# Patient Record
Sex: Female | Born: 1985 | Hispanic: No | Marital: Married | Smoking: Never smoker
Health system: Southern US, Community
[De-identification: ages and names within clinical notes are randomized; demographics above are authoritative.]

## PROBLEM LIST (undated history)

## (undated) DIAGNOSIS — J45909 Unspecified asthma, uncomplicated: Secondary | ICD-10-CM

## (undated) HISTORY — PX: WRIST SURGERY: SHX841

---

## 2010-05-05 ENCOUNTER — Other Ambulatory Visit (HOSPITAL_COMMUNITY): Payer: Self-pay | Admitting: Orthopedic Surgery

## 2010-05-05 DIAGNOSIS — R52 Pain, unspecified: Secondary | ICD-10-CM

## 2010-05-10 ENCOUNTER — Ambulatory Visit (HOSPITAL_COMMUNITY)
Admission: RE | Admit: 2010-05-10 | Discharge: 2010-05-10 | Disposition: A | Discharge status: Home / Self Care | Payer: 59 | Source: Ambulatory Visit | Attending: Orthopedic Surgery | Admitting: Orthopedic Surgery

## 2010-05-10 DIAGNOSIS — M25539 Pain in unspecified wrist: Secondary | ICD-10-CM | POA: Insufficient documentation

## 2010-05-10 DIAGNOSIS — R52 Pain, unspecified: Secondary | ICD-10-CM

## 2010-07-07 ENCOUNTER — Ambulatory Visit (HOSPITAL_BASED_OUTPATIENT_CLINIC_OR_DEPARTMENT_OTHER): Admission: RE | Admit: 2010-07-07 | Payer: 59 | Source: Ambulatory Visit | Admitting: Orthopedic Surgery

## 2010-07-07 ENCOUNTER — Ambulatory Visit (HOSPITAL_BASED_OUTPATIENT_CLINIC_OR_DEPARTMENT_OTHER)
Admission: RE | Admit: 2010-07-07 | Discharge: 2010-07-08 | Disposition: A | Discharge status: Home / Self Care | Payer: 59 | Source: Ambulatory Visit | Attending: Orthopedic Surgery | Admitting: Orthopedic Surgery

## 2010-07-07 DIAGNOSIS — J45909 Unspecified asthma, uncomplicated: Secondary | ICD-10-CM | POA: Insufficient documentation

## 2010-07-07 DIAGNOSIS — IMO0002 Reserved for concepts with insufficient information to code with codable children: Secondary | ICD-10-CM | POA: Insufficient documentation

## 2010-07-10 NOTE — Op Note (Signed)
[name], [name]                ACCOUNT NO.:  [id number]  MEDICAL RECORD NO.:  [id number]  LOCATION:                                 FACILITY:  PHYSICIAN:  Kimberly Ano. 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  POSTOPERATIVE DIAGNOSIS: 1. Right wrist scaphoid excision and four-point effusion with diamond     plate from [employer] and autologous bone graft in the     distal radius. 2. Posterior interosseous nerve neurectomy, right wrist. 3. EPL decompression anterior transposition, right wrist. 4. Stress radiography.  SURGEON:  Kimberly Ano. Kimberly Pea, MD  ASSISTANT:  None.  COMPLICATIONS:  None.  ANESTHESIA:  General with preoperative block.  TOURNIQUET TIME:  Less than 90 minutes.INDICATIONS FOR PROCEDURE:  This patient is a pleasant female who presents with the above-mentioned diagnosis.  I have counseled her in regards to risks and benefits of surgery including risk of infection, bleeding, anesthesia, damage to normal structures, and failure of surgery to accomplish its intended goals of relieving symptoms and restoring function.  With this in mind, she desires to proceed.  All questions have been encouraged and answered preoperatively.  OPERATIVE PROCEDURE:  The patient was seen by myself and Anesthesia, taken to operative suite.  Time-out had been called in the operative arena and verified.  Preoperative block given and she was of course, prepped and draped in the usual sterile fashion with Betadine scrub and paint.  Following a thorough prep and drape, the patient underwent elevation of the arm.  Tourniquet was insufflated to 250 mmHg.  A dorsal incision was made midline about the wrist.  Dissection was carried down. Skin flaps were  elevated.  The EPL was isolated out of its tendon sheath, transferred to the dorsal subcutaneous tissues after flap was elevated and fascia was released.  Following this, a continued Z lengthening was accomplished about the retinaculum and opening between the third and fourth dorsal compartment created.  I released the second dorsal compartment and then made access to the wrist joint.  Once this was done, I performed a posterior interosseous nerve neurectomy with crushing and cauterization technique which the patient tolerated well. Following this, the patient then underwent a Z opening incision about the wrist capsule.  Once this was done, I then excised the scaphoid, obtained autologous bone graft from the scaphoid, and then obtained in August bone graft from the curettage over Lister tubercle.  Following this, the 4 corners were denuded about the capitate, hamate, lunate, and triquetrum.  Once this was performed, I then placed ample amounts of the autologous bone graft in the 4 corners, reduced and pinned the capitate, hamate, lunate, and triquetrum.  I was shown stress radiography that the patient had adequate alignment, the lunate was in neutral position and there were no complicating features.  Following this, I placed a reamer for the diamond plate in standard fashion, reamed, and then placed the plate.  The templates were placed.  Reaming was accomplished. Additional bone graft  taken and following this, I then placed guide pins in the [employer] plate.  The pins were then replaced with screws in the lunate, followed by hamate, followed by triquetrum, followed by capitate.  These all achieved excellent purchase. Additional bone graft was placed.  Following this, the patient had 50% arc of motion, no complicating features, and all looked very well.  I was quite pleased with this and the findings.  Following this, I then closed the capsule.  Once the capsule was closed, I  then irrigated copiously once again as I did multiple points during the case.  I placed a portion of the retinaculum against the capsule and tucked this tight against the capsule.  I then placed another limb against the patient's retinaculum house of the fourth dorsal compartment so as to prevent both straining.  The patient tolerated this well and no complicating features.  Following this, I then performed a very careful and cautious closure with placement of the TLSO drain followed by closure with 3-0 Vicryl and Prolene.  I should note the capsule was nicely closed with FiberWire, the retinaculum (50% retinaculum) was placed against the capsule and the other 50% in the Z lengthened retinaculum.  The patient tolerated this well.  The EPL tendon was left in a transposed position.  She had 50% arc of motion, looked excellent.  There were no complicating features.  I irrigated copiously at multiple points during the procedure and was very pleased with stress radiographs.  Following closing the wound, we then placed her in a short-arm splint.  She will be placed in a short-arm splint.  We will see her back in 12 days.  She will be admitted for an overnight stay and observatory care with antibiotics, pain management and our standard postop algorithm.  In addition to this, I have discussed with her at 2 weeks postop we will cast her; at 4 weeks, we will remove splint; at 6 weeks, range of motion very gently and we will wait 8-10 weeks gain any strengthening.  Do's and dont's have been discussed.  All questions have been encouraged and answered.     Kimberly Ano. [name], M.D.     [facility]  D:  07/07/2010  T:  07/08/2010  Job:  [id number]  Electronically Signed by Dominica Severin M.D. on 07/10/2010 10:54:44 AM

## 2016-01-14 DIAGNOSIS — N6452 Nipple discharge: Secondary | ICD-10-CM | POA: Diagnosis not present

## 2016-01-14 DIAGNOSIS — Z131 Encounter for screening for diabetes mellitus: Secondary | ICD-10-CM | POA: Diagnosis not present

## 2016-01-14 DIAGNOSIS — Z6834 Body mass index (BMI) 34.0-34.9, adult: Secondary | ICD-10-CM | POA: Diagnosis not present

## 2016-01-14 DIAGNOSIS — Z01419 Encounter for gynecological examination (general) (routine) without abnormal findings: Secondary | ICD-10-CM | POA: Diagnosis not present

## 2016-01-14 DIAGNOSIS — Z113 Encounter for screening for infections with a predominantly sexual mode of transmission: Secondary | ICD-10-CM | POA: Diagnosis not present

## 2016-01-18 DIAGNOSIS — L03213 Periorbital cellulitis: Secondary | ICD-10-CM | POA: Diagnosis not present

## 2016-01-18 DIAGNOSIS — H00021 Hordeolum internum right upper eyelid: Secondary | ICD-10-CM | POA: Diagnosis not present

## 2016-02-02 DIAGNOSIS — L259 Unspecified contact dermatitis, unspecified cause: Secondary | ICD-10-CM | POA: Diagnosis not present

## 2016-03-23 DIAGNOSIS — H0013 Chalazion right eye, unspecified eyelid: Secondary | ICD-10-CM | POA: Diagnosis not present

## 2016-04-19 DIAGNOSIS — H0013 Chalazion right eye, unspecified eyelid: Secondary | ICD-10-CM | POA: Diagnosis not present

## 2016-05-20 DIAGNOSIS — J019 Acute sinusitis, unspecified: Secondary | ICD-10-CM | POA: Diagnosis not present

## 2016-05-20 DIAGNOSIS — B9689 Other specified bacterial agents as the cause of diseases classified elsewhere: Secondary | ICD-10-CM | POA: Diagnosis not present

## 2016-06-28 DIAGNOSIS — H52223 Regular astigmatism, bilateral: Secondary | ICD-10-CM | POA: Diagnosis not present

## 2016-12-04 DIAGNOSIS — J020 Streptococcal pharyngitis: Secondary | ICD-10-CM | POA: Diagnosis not present

## 2017-01-19 DIAGNOSIS — Z118 Encounter for screening for other infectious and parasitic diseases: Secondary | ICD-10-CM | POA: Diagnosis not present

## 2017-01-19 DIAGNOSIS — Z3041 Encounter for surveillance of contraceptive pills: Secondary | ICD-10-CM | POA: Diagnosis not present

## 2017-01-19 DIAGNOSIS — N760 Acute vaginitis: Secondary | ICD-10-CM | POA: Diagnosis not present

## 2017-01-19 DIAGNOSIS — R1032 Left lower quadrant pain: Secondary | ICD-10-CM | POA: Diagnosis not present

## 2017-01-19 DIAGNOSIS — Z113 Encounter for screening for infections with a predominantly sexual mode of transmission: Secondary | ICD-10-CM | POA: Diagnosis not present

## 2017-02-15 DIAGNOSIS — N83291 Other ovarian cyst, right side: Secondary | ICD-10-CM | POA: Diagnosis not present

## 2017-02-15 DIAGNOSIS — Z1159 Encounter for screening for other viral diseases: Secondary | ICD-10-CM | POA: Diagnosis not present

## 2017-02-15 DIAGNOSIS — Z01419 Encounter for gynecological examination (general) (routine) without abnormal findings: Secondary | ICD-10-CM | POA: Diagnosis not present

## 2017-02-15 DIAGNOSIS — Z6833 Body mass index (BMI) 33.0-33.9, adult: Secondary | ICD-10-CM | POA: Diagnosis not present

## 2017-02-25 DIAGNOSIS — J069 Acute upper respiratory infection, unspecified: Secondary | ICD-10-CM | POA: Diagnosis not present

## 2017-03-27 DIAGNOSIS — J019 Acute sinusitis, unspecified: Secondary | ICD-10-CM | POA: Diagnosis not present

## 2017-03-27 DIAGNOSIS — R05 Cough: Secondary | ICD-10-CM | POA: Diagnosis not present

## 2017-03-27 DIAGNOSIS — J45909 Unspecified asthma, uncomplicated: Secondary | ICD-10-CM | POA: Diagnosis not present

## 2017-04-10 DIAGNOSIS — E7849 Other hyperlipidemia: Secondary | ICD-10-CM | POA: Diagnosis not present

## 2017-04-10 DIAGNOSIS — F908 Attention-deficit hyperactivity disorder, other type: Secondary | ICD-10-CM | POA: Diagnosis not present

## 2017-04-10 DIAGNOSIS — Z0000 Encounter for general adult medical examination without abnormal findings: Secondary | ICD-10-CM | POA: Diagnosis not present

## 2017-04-10 DIAGNOSIS — R600 Localized edema: Secondary | ICD-10-CM | POA: Diagnosis not present

## 2018-01-09 NOTE — L&D Delivery Note (Signed)
Delivery Note At 3:19 AM a viable and healthy female was delivered via Vaginal, Spontaneous (Presentation:OA ; vtx ).  APGAR: 8, 9; weight pending .   Placenta status: spont, intact to path.( fetal heart rate) , .  Cord: short with the following complications:  avulsed.  Cord pH: none  Anesthesia:  epidural Episiotomy: None Lacerations:left  Periurethral; right Labial minora Suture Repair: 3.0 chromic Est. Blood Loss (mL): 480  Mom to postpartum.  Baby to Couplet care / Skin to Skin.  Stepheni Cameron A Shelvy Heckert 10/02/2018, 3:51 AM

## 2018-03-08 LAB — OB RESULTS CONSOLE HIV ANTIBODY (ROUTINE TESTING): HIV: NONREACTIVE

## 2018-03-08 LAB — OB RESULTS CONSOLE HEPATITIS B SURFACE ANTIGEN: Hepatitis B Surface Ag: NEGATIVE

## 2018-03-08 LAB — OB RESULTS CONSOLE RUBELLA ANTIBODY, IGM: Rubella: IMMUNE

## 2018-05-02 ENCOUNTER — Institutional Professional Consult (permissible substitution): Payer: Self-pay | Admitting: Internal Medicine

## 2018-05-02 ENCOUNTER — Other Ambulatory Visit: Payer: Self-pay

## 2018-05-02 ENCOUNTER — Encounter: Payer: Self-pay | Admitting: Internal Medicine

## 2018-05-02 ENCOUNTER — Ambulatory Visit (INDEPENDENT_AMBULATORY_CARE_PROVIDER_SITE_OTHER): Payer: 59 | Admitting: Internal Medicine

## 2018-05-02 VITALS — BP 106/70 | HR 114 | Ht 69.0 in | Wt 232.0 lb

## 2018-05-02 DIAGNOSIS — J4530 Mild persistent asthma, uncomplicated: Secondary | ICD-10-CM | POA: Insufficient documentation

## 2018-05-02 MED ORDER — BUDESONIDE-FORMOTEROL FUMARATE 80-4.5 MCG/ACT IN AERO: 2.0000 | INHALATION_SPRAY | Freq: Two times a day (BID) | RESPIRATORY_TRACT | 0 refills | Status: DC

## 2018-05-02 MED ORDER — BUDESONIDE-FORMOTEROL FUMARATE 80-4.5 MCG/ACT IN AERO: 2.0000 | INHALATION_SPRAY | Freq: Two times a day (BID) | RESPIRATORY_TRACT | 11 refills | Status: DC

## 2018-05-02 MED ORDER — ALBUTEROL SULFATE (2.5 MG/3ML) 0.083% IN NEBU: 2.5000 mg | INHALATION_SOLUTION | Freq: Four times a day (QID) | RESPIRATORY_TRACT | 1 refills | Status: DC | PRN

## 2018-05-02 NOTE — Patient Instructions (Addendum)
Plan A = Automatic = Symbicort 80 Take up to 2 puffs first thing in am and then another 2 puffs about 12 hours later.   Work on inhaler technique:  relax and gently blow all the way out then take a nice smooth deep breath back in, triggering the inhaler at same time you start breathing in.  Hold for up to 5 seconds if you can. Blow out thru nose. Rinse and gargle with water when done   Plan B = Backup Only use your albuterol inhaler as a rescue medication to be used if you can't catch your breath by resting or doing a relaxed purse lip breathing pattern.  - The less you use it, the better it will work when you need it. - Ok to use the inhaler up to 2 puffs  every 4 hours if you must but call for appointment if use goes up over your usual need - Don't leave home without it !!  (think of it like the spare tire for your car)   Plan C = Crisis - only use your albuterol nebulizer if you first try Plan B and it fails to help > ok to use the nebulizer up to every 4 hours but if start needing it regularly call for immediate appointment   Plan D = Doctor - call me if B and C not adequate  Plan E = ER - go to ER or call 911 if all else fails     GERD (REFLUX)  is an extremely common cause of respiratory symptoms just like yours , many times with no obvious heartburn at all.    It can be treated with medication, but also with lifestyle changes including elevation of the head of your bed (ideally with 6 -8inch blocks under the headboard of your bed),  Smoking cessation, avoidance of late meals, excessive alcohol, and avoid fatty foods, chocolate, peppermint, colas, red wine, and acidic juices such as orange juice.  NO MINT OR MENTHOL PRODUCTS SO NO COUGH DROPS  USE SUGARLESS CANDY INSTEAD (Jolley ranchers or Stover's or Life Savers) or even ice chips will also do - the key is to swallow to prevent all throat clearing. NO OIL BASED VITAMINS - use powdered substitutes.  Avoid fish oil when  coughing.  If worse then need to consider PPI next    Please schedule a follow up office visit in 4 weeks, sooner if needed

## 2018-05-02 NOTE — Progress Notes (Signed)
[name], female    DOB: 1985-05-20, 33 y.o.   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 Pulmonary Consult    Referred by Dr. Cherly Hensen. Pt dxed with Asthma when she was a child. She has been having DOE since she became preganant 16 wks ago.  She uses her albuterol inhaler 4 x per wk on average.   Dyspnea: indolent onset min progressive since [redacted] week gestation now =  MMRC1 = can walk nl pace, flat grade, can't hurry or go uphills or steps s sob   Cough: minimal / non productive does not flare noct asssoc over hb but and also some nasal congestion Sleep: some chest tightness at hs better p noct saba SABA use: up to several times a day, seems to help a lot    No obvious patterns in day to day or daytime variability or assoc excess/ purulent sputum or mucus plugs or hemoptysis or cp or chest tightness, subjective wheeze or overt sinus   symptoms.   Sleeps fine p hs saba  without nocturnal  or early am exacerbation  of respiratory  c/o's or need for noct saba. Also denies any obvious fluctuation of symptoms with weather or environmental changes or other aggravating or alleviating factors except as outlined above   No unusual exposure hx or   knowledge of premature birth.  Current Allergies, Complete Past Medical History, Past Surgical History, Family History, and Social History were reviewed in [employer] record.  ROS  The following are not active  complaints unless bolded Hoarseness, sore throat, dysphagia, dental problems, itching, sneezing,  nasal congestion or discharge of excess mucus or purulent secretions, ear ache,   fever, chills, sweats, unintended wt loss or wt gain, classically pleuritic or exertional cp,  orthopnea pnd or arm/hand swelling  or leg swelling, presyncope, palpitations, abdominal pain, anorexia, nausea, vomiting, diarrhea  or change in bowel habits or change in bladder habits, change in stools or change in urine, dysuria, hematuria,  rash, arthralgias, visual complaints, headache, numbness, weakness or ataxia or problems with walking or coordination,  change in mood or  memory.             No past medical history on file.  Outpatient Medications Prior to Visit  Medication Sig Dispense Refill  . albuterol (VENTOLIN HFA) 108 (90 Base) MCG/ACT inhaler Inhale 2 puffs into the lungs every 6 (six) hours as needed for wheezing or shortness of breath.    . doxylamine, Sleep, (UNISOM) 25 MG tablet Take 25 mg by mouth at bedtime.    . Pyridoxine HCl (B-6 PO) Take 1 capsule by mouth daily.    . promethazine (PHENERGAN) 25 MG tablet Take 1 tablet by mouth every 6 (six) hours as needed.        Objective:     BP 106/70 (BP Location: Left Arm, Cuff Size: Normal)  Pulse (!) 114   Ht 5\' 9"  (1.753 m)   Wt 232 lb (105.2 kg)   SpO2 97%   BMI 34.26 kg/m   SpO2: 97 %   RA  amb pleasant wf nad  HEENT: nl dentition, turbinates bilaterally, and oropharynx. Nl external ear canals without cough reflex   NECK :  without JVD/Nodes/TM/ nl carotid upstrokes bilaterally   LUNGS: no acc muscle use,  Nl contour chest which is clear to A and P bilaterally without cough on insp or exp maneuvers   CV:  RRR  no s3 or murmur or increase in P2, and no edema   ABD: c/w 16 wk iup/  soft and nontender with nl inspiratory excursion in the supine position. No bruits or organomegaly appreciated, bowel sounds nl  MS:  Nl gait/ ext  warm without deformities, calf tenderness, cyanosis or clubbing No obvious joint restrictions   SKIN: warm and dry without lesions    NEURO:  alert, approp, nl sensorium with  no motor or cerebellar deficits apparent.      Assessment   Chronic asthma, mild persistent, uncomplicated Onset age 89 but outgrew by HS / ? Flared with first iup assoc with nasal congestiona and HB - 05/02/2018  After extensive coaching inhaler device,  effectiveness =    75%  Try symb 80 2bid   Response to saba is very strong support for dx of asthma related to pregnancy but also has assoc hb and of course the effects of progesterone on resp drive and wt gain that may be contributing to her symptoms.  If this is asthma it is quite mild so reasonable to try symbicort 80 Based on two studies from NEJM  378; 20 p 1865 (2018) and 380 : p2020-30 (2019) in pts with mild asthma it is reasonable to use low dose symbicort eg 80 2bid "prn" flare in this setting but I emphasized this was only shown with symbicort and takes advantage of the rapid onset of action but is not the same as "rescue therapy" but can be stopped once the acute symptoms have resolved and the need for rescue has been minimized (< 2 x weekly)     >>>Discussed in detail all the  indications, usual  risks and alternatives  relative to the benefits with patient who agrees to proceed with symbicort 80 up to 2 q 12h  and just do gerd diet for now, add ppi as next step if still has hb and needing saba > 2 x weekly on max symb titration.  >>>>F/u q 4 weeks for now- call sooner if needed      Total time devoted to counseling  > 50 % of initial 60 min office visit:  review case with pt/ discussion of options/alternatives/ device teaching which extended face to face time for this visit personally creating written customized instructions  in presence of pt  then going over those specific  Instructions directly with the pt including how to use all of the meds but in  particular covering each new medication in detail and the difference between the maintenance= "automatic" meds and the prns using an action plan format for the latter (If this problem/symptom => do that organization reading Left to right).  Please see AVS from this visit for a full list of these instructions which I personally wrote for this pt and  are unique to this visit.       Sandrea HughsMichael , MD 05/02/2018

## 2018-05-03 ENCOUNTER — Encounter: Payer: Self-pay | Admitting: Internal Medicine

## 2018-05-03 NOTE — Assessment & Plan Note (Addendum)
Onset age 33 but outgrew by HS / ? Flared with first iup assoc with nasal congestiona and HB - 05/02/2018  After extensive coaching inhaler device,  effectiveness =    75%  Try symb 80 2bid   Response to saba is very strong support for dx of asthma related to pregnancy but also has assoc hb and of course the effects of progesterone on resp drive and wt gain that may be contributing to her symptoms.  If this is asthma it is quite mild so reasonable to try symbicort 80 Based on two studies from NEJM  378; 20 p 1865 (2018) and 380 : p2020-30 (2019) in pts with mild asthma it is reasonable to use low dose symbicort eg 80 2bid "prn" flare in this setting but I emphasized this was only shown with symbicort and takes advantage of the rapid onset of action but is not the same as "rescue therapy" but can be stopped once the acute symptoms have resolved and the need for rescue has been minimized (< 2 x weekly)     >>>Discussed in detail all the  indications, usual  risks and alternatives  relative to the benefits with patient who agrees to proceed with symbicort 80 up to 2 q 12h  and just do gerd diet for now, add ppi as next step if still has hb and needing saba > 2 x weekly on max symb titration.  >>>>F/u q 4 weeks for now- call sooner if needed    Total time devoted to counseling  > 50 % of initial 60 min office visit:  review case with pt/ discussion of options/alternatives/ device teaching which extended face to face time for this visit personally creating written customized instructions  in presence of pt  then going over those specific  Instructions directly with the pt including how to use all of the meds but in particular covering each new medication in detail and the difference between the maintenance= "automatic" meds and the prns using an action plan format for the latter (If this problem/symptom => do that organization reading Left to right).  Please see AVS from this visit for a full list of these  instructions which I personally wrote for this pt and  are unique to this visit.

## 2018-05-31 ENCOUNTER — Encounter: Payer: Self-pay | Admitting: Internal Medicine

## 2018-05-31 ENCOUNTER — Ambulatory Visit (INDEPENDENT_AMBULATORY_CARE_PROVIDER_SITE_OTHER): Payer: 59 | Admitting: Internal Medicine

## 2018-05-31 ENCOUNTER — Other Ambulatory Visit: Payer: Self-pay

## 2018-05-31 DIAGNOSIS — J4530 Mild persistent asthma, uncomplicated: Secondary | ICD-10-CM

## 2018-05-31 NOTE — Assessment & Plan Note (Signed)
Onset age 33 but outgrew by HS / ? Flared with first iup assoc with nasal congestiona and HB - 05/02/2018  After extensive coaching inhaler device,  effectiveness =    75%  Try symb 80 2bid  - 05/10/2018 added nexium > much improved and taperd symb to 80 2 each am  All goals of chronic asthma control met including optimal function and elimination of symptoms with minimal need for rescue therapy.  Contingencies discussed in full including contacting this office immediately if not controlling the symptoms using the rule of two's.  Since the gerd rx may have done more than the symbicort, ok to titrate symb up or down Based on two studies from [place]; 20 p 1865 (2018) and 380 : p2020-30 (2019) in pts with mild asthma it is reasonable to use low dose symbicort eg 80 2bid "prn" flare in this setting but I emphasized this was only shown with symbicort and takes advantage of the rapid onset of action but is not the same as "rescue therapy" but can be stopped once the acute symptoms have resolved and the need for rescue has been minimized (< 2 x weekly)     F/u 8 weeks - sooner prn

## 2018-05-31 NOTE — Progress Notes (Signed)
[name], female    DOB: 05-25-1985, 33 y.o.   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 Pulmonary Consult    Referred by Dr. Cherly Hensen. Pt dxed with Asthma when she was a child. She has been having DOE since she became preganant 16 wks ago.  She uses her albuterol inhaler 4 x per wk on average.   Dyspnea: indolent onset min progressive since [redacted] week gestation now =  MMRC1 = can walk nl pace, flat grade, can't hurry or go uphills or steps s sob   Cough: minimal / non productive does not flare noct asssoc over hb but and also some nasal congestion Sleep: some chest tightness at hs better p noct saba SABA use: up to several times a day, seems to help a lot  rec Plan A = Automatic = Symbicort 80 Take up to 2 puffs first thing in am and then another 2 puffs about 12 hours later.  Work on inhaler technique:  Plan B = Backup Only use your albuterol inhaler as a rescue medication Plan C = Crisis - only use your albuterol nebulizer if you first try Plan B  GERD diet If worse then need to consider PPI next     Virtual Visit via Telephone Note 05/31/2018  20 week IUP  symbicort 80 2 puffs each am  I connected with [name] on 05/31/18 at  4:30 PM EDT by telephone and verified that I am speaking with the correct person using two identifiers.   I discussed the limitations, risks, security and privacy concerns of performing an evaluation and management service by telephone  and the availability of in person appointments. I also discussed with the patient that there may be a patient responsible charge related to this service. The patient expressed understanding and agreed to proceed.   History of Present Illness: Dyspnea:  improved Cough: none/ better nasal symptoms Sleeping: flat/ one pillow SABA use: none  02: none  Added nexium one after     No obvious day to day or daytime variability or assoc excess/ purulent sputum or mucus plugs or hemoptysis or cp or chest tightness, subjective wheeze or overt sinus or hb symptoms.    Also denies any obvious fluctuation of symptoms with weather or environmental changes or other aggravating or alleviating factors except as outlined above.   Meds reviewed/ med reconciliation completed         Observations/Objective: Sounds great, no hoarsness or cough and speaking in full sentences    Assessment and Plan: See problem list for active a/p's   Follow Up Instructions: See avs for instructions unique to this ov which includes revised/ updated med list     I discussed the assessment and treatment plan with the patient. The patient was provided an opportunity to ask questions and all were answered. The patient agreed with the plan and demonstrated  an understanding of the instructions.   The patient was advised to call back or seek an in-person evaluation if the symptoms worsen or if the condition fails to improve as anticipated.  I provided 12 minutes of non-face-to-face time during this encounter.   Sandrea HughsMichael Wert, MD

## 2018-05-31 NOTE — Patient Instructions (Signed)
Symbicort can be used up to 2 pffs every hours if having any resp symptoms but stay on the dose then taper to the lowest dose that works   Please schedule a follow up office visit in 8 weeks, call sooner if needed with all medications /inhalers/ solutions in hand so we can verify exactly what you are taking. This includes all medications from all doctors and over the counters

## 2018-07-30 ENCOUNTER — Other Ambulatory Visit: Payer: Self-pay

## 2018-07-30 MED ORDER — BUDESONIDE-FORMOTEROL FUMARATE 80-4.5 MCG/ACT IN AERO: 2.0000 | INHALATION_SPRAY | Freq: Two times a day (BID) | RESPIRATORY_TRACT | 5 refills | Status: DC

## 2018-09-18 ENCOUNTER — Inpatient Hospital Stay (HOSPITAL_COMMUNITY)
Admission: AD | Admit: 2018-09-18 | Discharge: 2018-09-18 | Disposition: A | Discharge status: Home / Self Care | Payer: 59 | Attending: Obstetrics and Gynecology | Admitting: Obstetrics and Gynecology

## 2018-09-18 ENCOUNTER — Other Ambulatory Visit: Payer: Self-pay

## 2018-09-18 ENCOUNTER — Encounter (HOSPITAL_COMMUNITY): Payer: Self-pay | Admitting: *Deleted

## 2018-09-18 DIAGNOSIS — O26893 Other specified pregnancy related conditions, third trimester: Secondary | ICD-10-CM | POA: Insufficient documentation

## 2018-09-18 DIAGNOSIS — Z3A36 36 weeks gestation of pregnancy: Secondary | ICD-10-CM | POA: Diagnosis not present

## 2018-09-18 DIAGNOSIS — R609 Edema, unspecified: Secondary | ICD-10-CM | POA: Diagnosis not present

## 2018-09-18 DIAGNOSIS — R519 Headache, unspecified: Secondary | ICD-10-CM

## 2018-09-18 DIAGNOSIS — R51 Headache: Secondary | ICD-10-CM | POA: Insufficient documentation

## 2018-09-18 HISTORY — DX: Unspecified asthma, uncomplicated: J45.909

## 2018-09-18 LAB — URINALYSIS, ROUTINE W REFLEX MICROSCOPIC
Bilirubin Urine: NEGATIVE
Glucose, UA: NEGATIVE mg/dL
Hgb urine dipstick: NEGATIVE
Ketones, ur: 5 mg/dL — AB
Nitrite: NEGATIVE
Protein, ur: NEGATIVE mg/dL
Specific Gravity, Urine: 1.004 — ABNORMAL LOW (ref 1.005–1.030)
pH: 6 (ref 5.0–8.0)

## 2018-09-18 LAB — COMPREHENSIVE METABOLIC PANEL
ALT: 14 U/L (ref 0–44)
AST: 19 U/L (ref 15–41)
Albumin: 2.6 g/dL — ABNORMAL LOW (ref 3.5–5.0)
Alkaline Phosphatase: 96 U/L (ref 38–126)
Anion gap: 10 (ref 5–15)
BUN: 5 mg/dL — ABNORMAL LOW (ref 6–20)
CO2: 20 mmol/L — ABNORMAL LOW (ref 22–32)
Calcium: 9.6 mg/dL (ref 8.9–10.3)
Chloride: 106 mmol/L (ref 98–111)
Creatinine, Ser: 0.48 mg/dL (ref 0.44–1.00)
GFR calc Af Amer: >60 mL/min (ref >60)
GFR calc non Af Amer: >60 mL/min (ref >60)
Glucose, Bld: 75 mg/dL (ref 70–99)
Potassium: 3.7 mmol/L (ref 3.5–5.1)
Sodium: 136 mmol/L (ref 135–145)
Total Bilirubin: 0.4 mg/dL (ref 0.3–1.2)
Total Protein: 5.9 g/dL — ABNORMAL LOW (ref 6.5–8.1)

## 2018-09-18 LAB — PROTEIN / CREATININE RATIO, URINE
Creatinine, Urine: 25.24 mg/dL
Total Protein, Urine: <6 mg/dL

## 2018-09-18 LAB — CBC
HCT: 34 % — ABNORMAL LOW (ref 36.0–46.0)
Hemoglobin: 11.4 g/dL — ABNORMAL LOW (ref 12.0–15.0)
MCH: 27.3 pg (ref 26.0–34.0)
MCHC: 33.5 g/dL (ref 30.0–36.0)
MCV: 81.5 fL (ref 80.0–100.0)
Platelets: 251 10*3/uL (ref 150–400)
RBC: 4.17 MIL/uL (ref 3.87–5.11)
RDW: 13.1 % (ref 11.5–15.5)
WBC: 9.6 10*3/uL (ref 4.0–10.5)
nRBC: 0 % (ref 0.0–0.2)

## 2018-09-18 NOTE — MAU Note (Signed)
.   [name] is a 33 y.o. at [redacted]w[redacted]d here in MAU reporting: headache with swelling for a week. Pt states she is have periods of floaters in her vision.   Onset of complaint: a week Pain score: 5 Vitals:   09/18/18 1730  BP: (!) 158/91  Pulse: 82  Resp: 16  Temp: 98.3 F (36.8 C)     FHT:130 Lab orders placed from triage: UA

## 2018-09-18 NOTE — MAU Provider Note (Signed)
History     Chief Complaint  Patient presents with  . Hypertension  . Headache    33 yo G1P0 WF @ 33 6/[redacted] weeks gestation presents with c/o headache, BP 140/70 at work, leg swelling. Pt has had heartburn for which she takes med and it has not changed   OB History    Gravida  1   Para      Term      Preterm      AB      Living        SAB      TAB      Ectopic      Multiple      Live Births              Past Medical History:  Diagnosis Date  . Asthma     Past Surgical History:  Procedure Laterality Date  . WRIST SURGERY      Family History  Problem Relation Age of Onset  . Asthma Mother     Social History   Tobacco Use  . Smoking status: Never Smoker  . Smokeless tobacco: Never Used  Substance Use Topics  . Alcohol use: Not Currently  . Drug use: Never    Allergies: No Known Allergies  Medications Prior to Admission  Medication Sig Dispense Refill Last Dose  . albuterol (PROVENTIL) (2.5 MG/3ML) 0.083% nebulizer solution Take 3 mLs (2.5 mg total) by nebulization every 6 (six) hours as needed for wheezing or shortness of breath. 75 mL 1 Past Month at Unknown time  . albuterol (VENTOLIN HFA) 108 (90 Base) MCG/ACT inhaler Inhale 2 puffs into the lungs every 6 (six) hours as needed for wheezing or shortness of breath.   Past Month at Unknown time  . budesonide-formoterol (SYMBICORT) 80-4.5 MCG/ACT inhaler Inhale 2 puffs into the lungs 2 (two) times daily. 1 Inhaler 5 Past Month at Unknown time  . doxylamine, Sleep, (UNISOM) 25 MG tablet Take 25 mg by mouth at bedtime.   09/17/2018 at Unknown time  . promethazine (PHENERGAN) 25 MG tablet Take 1 tablet by mouth every 6 (six) hours as needed.   Past Month at Unknown time  . Pyridoxine HCl (B-6 PO) Take 1 capsule by mouth daily.   09/17/2018 at Unknown time     Physical Exam   Blood pressure 126/80, pulse 81, temperature 98.3 F (36.8 C), resp. rate 16, height 5\' 8"  (1.727 m), weight 122.9 kg, last  menstrual period 01/09/2018, SpO2 99 %.  General appearance: alert, cooperative and no distress Lungs: clear to auscultation bilaterally Heart: regular rate and rhythm, S1, S2 normal, no murmur, click, rub or gallop Abdomen: gravid Extremities: edema +1   Tracing: baseliine 130 (+) accel to 160 irreg ctx  ED Course  Headache in pregnancy IUP@ 33 6/7 weeks P) PIH labs MDM  addendum CBC Latest Ref Rng & Units 09/18/2018  WBC 4.0 - 10.5 K/uL 9.6  Hemoglobin 12.0 - 15.0 g/dL 11.4(L)  Hematocrit 36.0 - 46.0 % 34.0(L)  Platelets 150 - 400 K/uL 251   CMP Latest Ref Rng & Units 09/18/2018  Glucose 70 - 99 mg/dL 75  BUN 6 - 20 mg/dL 5(L)  Creatinine 1.610.44 - 1.00 mg/dL 0.960.48  Sodium 045135 - 409145 mmol/L 136  Potassium 3.5 - 5.1 mmol/L 3.7  Chloride 98 - 111 mmol/L 106  CO2 22 - 32 mmol/L 20(L)  Calcium 8.9 - 10.3 mg/dL 9.6  Total Protein 6.5 - 8.1 g/dL 8.1(X5.9(L)  Total Bilirubin 0.3 - 1.2 mg/dL 0.4  Alkaline Phos 38 - 126 U/L 96  AST 15 - 41 U/L 19  ALT 0 - 44 U/L 14  PCR pending  no evidence of preeclampsia D/c home  F/u office Pre eclampsia warning signs Marvene Staff, MD 7:16 PM 09/18/2018

## 2018-10-01 ENCOUNTER — Other Ambulatory Visit: Payer: Self-pay | Admitting: Obstetrics and Gynecology

## 2018-10-01 ENCOUNTER — Inpatient Hospital Stay (HOSPITAL_COMMUNITY): Payer: 59 | Admitting: Anesthesiology

## 2018-10-01 ENCOUNTER — Other Ambulatory Visit: Payer: Self-pay

## 2018-10-01 ENCOUNTER — Inpatient Hospital Stay (HOSPITAL_COMMUNITY)
Admission: RE | Admit: 2018-10-01 | Discharge: 2018-10-03 | DRG: 806 | Disposition: A | Discharge status: Home / Self Care | Payer: 59 | Attending: Obstetrics and Gynecology | Admitting: Obstetrics and Gynecology

## 2018-10-01 ENCOUNTER — Encounter (HOSPITAL_COMMUNITY): Payer: Self-pay | Admitting: *Deleted

## 2018-10-01 DIAGNOSIS — O9081 Anemia of the puerperium: Secondary | ICD-10-CM | POA: Diagnosis not present

## 2018-10-01 DIAGNOSIS — O99824 Streptococcus B carrier state complicating childbirth: Secondary | ICD-10-CM | POA: Diagnosis present

## 2018-10-01 DIAGNOSIS — Z3A37 37 weeks gestation of pregnancy: Secondary | ICD-10-CM | POA: Diagnosis not present

## 2018-10-01 DIAGNOSIS — J4530 Mild persistent asthma, uncomplicated: Secondary | ICD-10-CM | POA: Diagnosis present

## 2018-10-01 DIAGNOSIS — O9952 Diseases of the respiratory system complicating childbirth: Secondary | ICD-10-CM | POA: Diagnosis present

## 2018-10-01 DIAGNOSIS — D62 Acute posthemorrhagic anemia: Secondary | ICD-10-CM | POA: Diagnosis not present

## 2018-10-01 DIAGNOSIS — Z3490 Encounter for supervision of normal pregnancy, unspecified, unspecified trimester: Secondary | ICD-10-CM | POA: Diagnosis present

## 2018-10-01 DIAGNOSIS — O99214 Obesity complicating childbirth: Secondary | ICD-10-CM | POA: Diagnosis present

## 2018-10-01 DIAGNOSIS — O134 Gestational [pregnancy-induced] hypertension without significant proteinuria, complicating childbirth: Principal | ICD-10-CM | POA: Diagnosis present

## 2018-10-01 DIAGNOSIS — Z23 Encounter for immunization: Secondary | ICD-10-CM | POA: Diagnosis not present

## 2018-10-01 DIAGNOSIS — Z20828 Contact with and (suspected) exposure to other viral communicable diseases: Secondary | ICD-10-CM | POA: Diagnosis present

## 2018-10-01 LAB — COMPREHENSIVE METABOLIC PANEL
ALT: 15 U/L (ref 0–44)
AST: 22 U/L (ref 15–41)
Albumin: 2.5 g/dL — ABNORMAL LOW (ref 3.5–5.0)
Alkaline Phosphatase: 110 U/L (ref 38–126)
Anion gap: 10 (ref 5–15)
BUN: <5 mg/dL — ABNORMAL LOW (ref 6–20)
CO2: 19 mmol/L — ABNORMAL LOW (ref 22–32)
Calcium: 8.5 mg/dL — ABNORMAL LOW (ref 8.9–10.3)
Chloride: 109 mmol/L (ref 98–111)
Creatinine, Ser: 0.73 mg/dL (ref 0.44–1.00)
GFR calc Af Amer: >60 mL/min (ref >60)
GFR calc non Af Amer: >60 mL/min (ref >60)
Glucose, Bld: 89 mg/dL (ref 70–99)
Potassium: 3.7 mmol/L (ref 3.5–5.1)
Sodium: 138 mmol/L (ref 135–145)
Total Bilirubin: 0.3 mg/dL (ref 0.3–1.2)
Total Protein: 5.8 g/dL — ABNORMAL LOW (ref 6.5–8.1)

## 2018-10-01 LAB — CBC
HCT: 35.2 % — ABNORMAL LOW (ref 36.0–46.0)
Hemoglobin: 11.5 g/dL — ABNORMAL LOW (ref 12.0–15.0)
MCH: 26.8 pg (ref 26.0–34.0)
MCHC: 32.7 g/dL (ref 30.0–36.0)
MCV: 82.1 fL (ref 80.0–100.0)
Platelets: 242 10*3/uL (ref 150–400)
RBC: 4.29 MIL/uL (ref 3.87–5.11)
RDW: 13.5 % (ref 11.5–15.5)
WBC: 8.7 10*3/uL (ref 4.0–10.5)
nRBC: 0 % (ref 0.0–0.2)

## 2018-10-01 LAB — PROTEIN / CREATININE RATIO, URINE
Creatinine, Urine: 117.16 mg/dL
Protein Creatinine Ratio: 0.26 mg/mg{Cre} — ABNORMAL HIGH (ref 0.00–0.15)
Total Protein, Urine: 30 mg/dL

## 2018-10-01 LAB — LACTATE DEHYDROGENASE: LDH: 139 U/L (ref 98–192)

## 2018-10-01 LAB — ABO/RH: ABO/RH(D): A POS

## 2018-10-01 LAB — TYPE AND SCREEN
ABO/RH(D): A POS
Antibody Screen: NEGATIVE

## 2018-10-01 LAB — OB RESULTS CONSOLE RUBELLA ANTIBODY, IGM: Rubella: IMMUNE

## 2018-10-01 LAB — SARS CORONAVIRUS 2 BY RT PCR (HOSPITAL ORDER, PERFORMED IN ~~LOC~~ HOSPITAL LAB): SARS Coronavirus 2: NEGATIVE

## 2018-10-01 LAB — OB RESULTS CONSOLE HEPATITIS B SURFACE ANTIGEN: Hepatitis B Surface Ag: NEGATIVE

## 2018-10-01 LAB — URIC ACID: Uric Acid, Serum: 5.7 mg/dL (ref 2.5–7.1)

## 2018-10-01 LAB — OB RESULTS CONSOLE HIV ANTIBODY (ROUTINE TESTING): HIV: NONREACTIVE

## 2018-10-01 LAB — OB RESULTS CONSOLE GBS: GBS: POSITIVE

## 2018-10-01 LAB — RPR: RPR Ser Ql: NONREACTIVE

## 2018-10-01 MED ORDER — OXYTOCIN BOLUS FROM INFUSION
500.0000 mL | Freq: Once | INTRAVENOUS | Status: AC
  Administered 2018-10-02: 500 mL via INTRAVENOUS

## 2018-10-01 MED ORDER — OXYTOCIN 40 UNITS IN NORMAL SALINE INFUSION - SIMPLE MED: 2.5000 [IU]/h | INTRAVENOUS | Status: DC

## 2018-10-01 MED ORDER — PHENYLEPHRINE 40 MCG/ML (10ML) SYRINGE FOR IV PUSH (FOR BLOOD PRESSURE SUPPORT)
80.0000 ug | PREFILLED_SYRINGE | INTRAVENOUS | Status: DC | PRN
  Filled 2018-10-01: qty 10

## 2018-10-01 MED ORDER — SOD CITRATE-CITRIC ACID 500-334 MG/5ML PO SOLN: 30.0000 mL | ORAL | Status: DC | PRN

## 2018-10-01 MED ORDER — PENICILLIN G 3 MILLION UNITS IVPB - SIMPLE MED
3.0000 10*6.[IU] | INTRAVENOUS | Status: DC
  Administered 2018-10-01 (×3): 3 10*6.[IU] via INTRAVENOUS
  Filled 2018-10-01 (×4): qty 100

## 2018-10-01 MED ORDER — PHENYLEPHRINE 40 MCG/ML (10ML) SYRINGE FOR IV PUSH (FOR BLOOD PRESSURE SUPPORT): 80.0000 ug | PREFILLED_SYRINGE | INTRAVENOUS | Status: DC | PRN

## 2018-10-01 MED ORDER — LIDOCAINE-EPINEPHRINE (PF) 2 %-1:200000 IJ SOLN
INTRAMUSCULAR | Status: DC | PRN
  Administered 2018-10-01 (×2): 2 mL via EPIDURAL

## 2018-10-01 MED ORDER — LACTATED RINGERS IV SOLN
INTRAVENOUS | Status: DC
  Administered 2018-10-01: 21:00:00 via INTRAVENOUS

## 2018-10-01 MED ORDER — LIDOCAINE HCL (PF) 1 % IJ SOLN
30.0000 mL | INTRAMUSCULAR | Status: AC | PRN
  Administered 2018-10-02: 30 mL via SUBCUTANEOUS
  Filled 2018-10-01: qty 30

## 2018-10-01 MED ORDER — EPHEDRINE 5 MG/ML INJ: 10.0000 mg | INTRAVENOUS | Status: DC | PRN

## 2018-10-01 MED ORDER — ACETAMINOPHEN 325 MG PO TABS
650.0000 mg | ORAL_TABLET | ORAL | Status: DC | PRN
  Filled 2018-10-01: qty 2

## 2018-10-01 MED ORDER — FENTANYL-BUPIVACAINE-NACL 0.5-0.125-0.9 MG/250ML-% EP SOLN
12.0000 mL/h | EPIDURAL | Status: DC | PRN
  Filled 2018-10-01: qty 250

## 2018-10-01 MED ORDER — DIPHENHYDRAMINE HCL 50 MG/ML IJ SOLN: 12.5000 mg | INTRAMUSCULAR | Status: DC | PRN

## 2018-10-01 MED ORDER — OXYTOCIN 40 UNITS IN NORMAL SALINE INFUSION - SIMPLE MED
1.0000 m[IU]/min | INTRAVENOUS | Status: DC
  Administered 2018-10-01: 2 m[IU]/min via INTRAVENOUS
  Filled 2018-10-01: qty 1000

## 2018-10-01 MED ORDER — TERBUTALINE SULFATE 1 MG/ML IJ SOLN: 0.2500 mg | Freq: Once | INTRAMUSCULAR | Status: DC | PRN

## 2018-10-01 MED ORDER — ONDANSETRON HCL 4 MG/2ML IJ SOLN
4.0000 mg | Freq: Four times a day (QID) | INTRAMUSCULAR | Status: DC | PRN
  Administered 2018-10-01: 4 mg via INTRAVENOUS
  Filled 2018-10-01: qty 2

## 2018-10-01 MED ORDER — LACTATED RINGERS IV SOLN: 500.0000 mL | INTRAVENOUS | Status: DC | PRN

## 2018-10-01 MED ORDER — PANTOPRAZOLE SODIUM 40 MG PO TBEC
40.0000 mg | DELAYED_RELEASE_TABLET | Freq: Every day | ORAL | Status: DC
  Administered 2018-10-01: 40 mg via ORAL
  Filled 2018-10-01: qty 1

## 2018-10-01 MED ORDER — SODIUM CHLORIDE 0.9 % IV SOLN
5.0000 10*6.[IU] | Freq: Once | INTRAVENOUS | Status: AC
  Administered 2018-10-01: 5 10*6.[IU] via INTRAVENOUS
  Filled 2018-10-01: qty 5

## 2018-10-01 MED ORDER — LACTATED RINGERS IV SOLN: 500.0000 mL | Freq: Once | INTRAVENOUS | Status: DC

## 2018-10-01 NOTE — Progress Notes (Signed)
Pt comfortable Denies ctx/lof/vb; +FM   Pulse Rate:  [83-94] 83 (09/22 1118) Resp:  [16] 16 (09/22 1118) BP: (133-137)/(80-92) 137/80 (09/22 1118)  A&ox3 nml respirations Abd: soft, nt, gravid Cx: 2/97/-9; cephalic; balloon catheter placed w/o difficulty, inflated to 60  FHT: 150, nml variability, +accels, no decels Toco: rare contraction  A/P: iup at 37.6 1. GHTN - labs pending; for IOL; request by primary provider to place balloon catheter to assist with IOL; placed w/o difficulty; bp mild range and will monitor closely 2. Fetal status reassuring

## 2018-10-01 NOTE — Progress Notes (Signed)
S; epidural Slight  h/a comfortable SROM @ 3 pm Intracervical balloon out O:  Patient Vitals for the past 24 hrs:  BP Temp Temp src Pulse Resp Height Weight  10/01/18 1802 110/67 - - 71 - - -  10/01/18 1754 (!) 119/59 - - 68 - - -  10/01/18 1753 - 98.3 F (36.8 C) Oral - - - -  10/01/18 1731 117/84 - - 74 16 - -  10/01/18 1730 127/77 - - 67 - - -  10/01/18 1716 133/81 - - 75 - - -  10/01/18 1712 (!) 141/93 - - 74 - - -  10/01/18 1708 (!) 140/94 - - 76 - - -  10/01/18 1701 (!) 149/91 - - 76 - - -  10/01/18 1631 133/83 - - 73 - - -  10/01/18 1601 132/89 - - 72 - - -  10/01/18 1535 (!) 146/93 - - 69 - - -  10/01/18 1503 (!) 150/77 - - 82 - - -  10/01/18 1431 125/80 - - 84 - - -  10/01/18 1401 (!) 128/91 - - 67 - - -  10/01/18 1331 129/85 - - 70 - - -  10/01/18 1301 136/89 - - 80 - - -  10/01/18 1231 140/83 - - 73 - - -  10/01/18 1203 - - - - - 5\' 9"  (1.753 m) 125.2 kg  10/01/18 1201 136/79 - - 79 - - -  10/01/18 1152 136/79 - - 74 - - -  10/01/18 1118 137/80 - - 83 16 - -  10/01/18 1043 (!) 133/92 - - 94 - - -  VE 4/70/-2 Asynclitic LOA   Tracing: baseline 120 (+) accel to 150-160 ? Ctx freq IUPC placed  CBC Latest Ref Rng & Units 10/01/2018 09/18/2018  WBC 4.0 - 10.5 K/uL 8.7 9.6  Hemoglobin 12.0 - 15.0 g/dL 11.5(L) 11.4(L)  Hematocrit 36.0 - 46.0 % 35.2(L) 34.0(L)  Platelets 150 - 400 K/uL 242 251   CMP Latest Ref Rng & Units 10/01/2018 09/18/2018  Glucose 70 - 99 mg/dL 89 75  BUN 6 - 20 mg/dL <5(L) 5(L)  Creatinine 0.44 - 1.00 mg/dL 0.73 0.48  Sodium 135 - 145 mmol/L 138 136  Potassium 3.5 - 5.1 mmol/L 3.7 3.7  Chloride 98 - 111 mmol/L 109 106  CO2 22 - 32 mmol/L 19(L) 20(L)  Calcium 8.9 - 10.3 mg/dL 8.5(L) 9.6  Total Protein 6.5 - 8.1 g/dL 5.8(L) 5.9(L)  Total Bilirubin 0.3 - 1.2 mg/dL 0.3 0.4  Alkaline Phos 38 - 126 U/L 110 96  AST 15 - 41 U/L 22 19  ALT 0 - 44 U/L 15 14  IMp: gestational HTN GBS cx (+) IUP @ 37 6/7 week P) right exaggerated sims. Cont with  pitocin. cotn IV PCN

## 2018-10-01 NOTE — Anesthesia Preprocedure Evaluation (Addendum)
Anesthesia Evaluation  Patient identified by MRN, date of birth, ID band Patient awake    Reviewed: Allergy & Precautions, NPO status , Patient's Chart, lab work & pertinent test results  Airway Mallampati: II  TM Distance: >3 FB Neck ROM: Full    Dental no notable dental hx.    Pulmonary asthma ,    Pulmonary exam normal breath sounds clear to auscultation       Cardiovascular negative cardio ROS Normal cardiovascular exam Rhythm:Regular Rate:Normal     Neuro/Psych negative neurological ROS  negative psych ROS   GI/Hepatic negative GI ROS, Neg liver ROS,   Endo/Other  Morbid obesity  Renal/GU negative Renal ROS  negative genitourinary   Musculoskeletal negative musculoskeletal ROS (+)   Abdominal   Peds  Hematology negative hematology ROS (+)   Anesthesia Other Findings IOL for preE   Reproductive/Obstetrics (+) Pregnancy                             Anesthesia Physical Anesthesia Plan  ASA: III  Anesthesia Plan: Epidural   Post-op Pain Management:    Induction:   PONV Risk Score and Plan: Treatment may vary due to age or medical condition  Airway Management Planned: Natural Airway  Additional Equipment:   Intra-op Plan:   Post-operative Plan:   Informed Consent: I have reviewed the patients History and Physical, chart, labs and discussed the procedure including the risks, benefits and alternatives for the proposed anesthesia with the patient or authorized representative who has indicated his/her understanding and acceptance.       Plan Discussed with: Anesthesiologist  Anesthesia Plan Comments: (Patient identified. Risks, benefits, options discussed with patient including but not limited to bleeding, infection, nerve damage, paralysis, failed block, incomplete pain control, headache, blood pressure changes, nausea, vomiting, reactions to medication, itching, and post  partum back pain. Confirmed with bedside nurse the patient's most recent platelet count. Confirmed with the patient that they are not taking any anticoagulation, have any bleeding history or any family history of bleeding disorders. Patient expressed understanding and wishes to proceed. All questions were answered. )        Anesthesia Quick Evaluation

## 2018-10-01 NOTE — H&P (Signed)
[name] is a 33 y.o. female presenting @ 37 6/7 weeks for IOL 2nd to gestational HTN. (+) sl h/a. (+) leg swelling. On Protonix for heartburn  OB History    Gravida  1   Para      Term      Preterm      AB      Living        SAB      TAB      Ectopic      Multiple      Live Births             Past Medical History:  Diagnosis Date  . Asthma    Past Surgical History:  Procedure Laterality Date  . WRIST SURGERY     Family History: family history includes Asthma in her mother. Social History:  reports that she has never smoked. She has never used smokeless tobacco. She reports previous alcohol use. She reports that she does not use drugs.     Maternal Diabetes: No Genetic Screening: Normal Maternal Ultrasounds/Referrals: Normal Fetal Ultrasounds or other Referrals:  None Maternal Substance Abuse:  No Significant Maternal Medications:  Meds include: Protonix Significant Maternal Lab Results:  Group B Strep positive Other Comments:  None  Review of Systems  Eyes: Negative for blurred vision.  Cardiovascular: Positive for leg swelling.  Gastrointestinal: Negative for heartburn and nausea.  Neurological: Positive for headaches.   Maternal Medical History:  Reason for admission: Nausea.     Blood pressure (!) 122/94, pulse 72, temperature 98.2 F (36.8 C), temperature source Oral, resp. rate 16, height 5\' 9"  (1.753 m), weight 125.2 kg, last menstrual period 01/09/2018, SpO2 100 %. Exam Physical Exam  Constitutional: She is oriented to person, place, and time. She appears well-developed and well-nourished.  HENT:  Head: Atraumatic.  Eyes: EOM are normal.  Neck: Neck supple.  Cardiovascular: Regular rhythm.  Respiratory: Breath sounds normal.  GI: Soft.  Musculoskeletal:        General: Edema present.  Neurological: She is alert and oriented to person, place, and time. She has normal reflexes.  Skin: Skin is warm and dry.   Psychiatric: She has a normal mood and affect.    Prenatal labs: ABO, Rh: --/--/A POS, A POS Performed at [facility], [place] [address], [place], [place] [address]  [phone]/22 1045) Antibody: NEG (09/22 1045) Rubella:  Immune RPR: NON REACTIVE (09/22 1043)  HBsAg:   neg HIV:   neg GBS:   positive  Assessment/Plan:  IMP: gestational HTN  GBS cx (+) IUP @ 37 6/7 weeks P) admit . IV PCN. [place] labs. Intracervical balloon. Pitocin. Epidural prn Chana Lindstrom A Arrick Dutton 10/01/2018, 8:45 PM

## 2018-10-01 NOTE — Anesthesia Procedure Notes (Signed)
Epidural Patient location during procedure: OB Start time: 10/01/2018 5:00 PM End time: 10/01/2018 5:15 PM  Staffing Anesthesiologist: Freddrick March, MD Performed: anesthesiologist   Preanesthetic Checklist Completed: patient identified, pre-op evaluation, timeout performed, IV checked, risks and benefits discussed and monitors and equipment checked  Epidural Patient position: sitting Prep: site prepped and draped and DuraPrep Patient monitoring: continuous pulse ox, blood pressure, heart rate and cardiac monitor Approach: midline Location: L3-L4 Injection technique: LOR air  Needle:  Needle type: Tuohy  Needle gauge: 17 G Needle length: 9 cm Needle insertion depth: 6 cm Catheter type: closed end flexible Catheter size: 19 Gauge Catheter at skin depth: 12 cm Test dose: negative  Assessment Sensory level: T8 Events: blood not aspirated, injection not painful, no injection resistance, negative IV test and no paresthesia  Additional Notes Patient identified. Risks/Benefits/Options discussed with patient including but not limited to bleeding, infection, nerve damage, paralysis, failed block, incomplete pain control, headache, blood pressure changes, nausea, vomiting, reactions to medication both or allergic, itching and postpartum back pain. Confirmed with bedside nurse the patient's most recent platelet count. Confirmed with patient that they are not currently taking any anticoagulation, have any bleeding history or any family history of bleeding disorders. Patient expressed understanding and wished to proceed. All questions were answered. Sterile technique was used throughout the entire procedure. Please see nursing notes for vital signs. Test dose was given through epidural catheter and negative prior to continuing to dose epidural or start infusion. Warning signs of high block given to the patient including shortness of breath, tingling/numbness in hands, complete motor block,  or any concerning symptoms with instructions to call for help. Patient was given instructions on fall risk and not to get out of bed. All questions and concerns addressed with instructions to call with any issues or inadequate analgesia.  Reason for block:procedure for pain

## 2018-10-02 ENCOUNTER — Encounter (HOSPITAL_COMMUNITY): Payer: Self-pay

## 2018-10-02 LAB — CBC
HCT: 34 % — ABNORMAL LOW (ref 36.0–46.0)
Hemoglobin: 11 g/dL — ABNORMAL LOW (ref 12.0–15.0)
MCH: 27 pg (ref 26.0–34.0)
MCHC: 32.4 g/dL (ref 30.0–36.0)
MCV: 83.5 fL (ref 80.0–100.0)
Platelets: 196 10*3/uL (ref 150–400)
RBC: 4.07 MIL/uL (ref 3.87–5.11)
RDW: 13.5 % (ref 11.5–15.5)
WBC: 13.2 10*3/uL — ABNORMAL HIGH (ref 4.0–10.5)
nRBC: 0 % (ref 0.0–0.2)

## 2018-10-02 MED ORDER — COCONUT OIL OIL
1.0000 "application " | TOPICAL_OIL | Status: DC | PRN
  Administered 2018-10-03: 1 via TOPICAL

## 2018-10-02 MED ORDER — FERROUS SULFATE 325 (65 FE) MG PO TABS
325.0000 mg | ORAL_TABLET | Freq: Two times a day (BID) | ORAL | Status: DC
  Administered 2018-10-02 – 2018-10-03 (×3): 325 mg via ORAL
  Filled 2018-10-02 (×3): qty 1

## 2018-10-02 MED ORDER — IBUPROFEN 600 MG PO TABS
600.0000 mg | ORAL_TABLET | Freq: Four times a day (QID) | ORAL | Status: DC
  Administered 2018-10-02 – 2018-10-03 (×6): 600 mg via ORAL
  Filled 2018-10-02 (×6): qty 1

## 2018-10-02 MED ORDER — DIPHENHYDRAMINE HCL 25 MG PO CAPS: 25.0000 mg | ORAL_CAPSULE | Freq: Four times a day (QID) | ORAL | Status: DC | PRN

## 2018-10-02 MED ORDER — DIBUCAINE (PERIANAL) 1 % EX OINT: 1.0000 "application " | TOPICAL_OINTMENT | CUTANEOUS | Status: DC | PRN

## 2018-10-02 MED ORDER — ONDANSETRON HCL 4 MG PO TABS: 4.0000 mg | ORAL_TABLET | ORAL | Status: DC | PRN

## 2018-10-02 MED ORDER — ZOLPIDEM TARTRATE 5 MG PO TABS: 5.0000 mg | ORAL_TABLET | Freq: Every evening | ORAL | Status: DC | PRN

## 2018-10-02 MED ORDER — WITCH HAZEL-GLYCERIN EX PADS: 1.0000 "application " | MEDICATED_PAD | CUTANEOUS | Status: DC | PRN

## 2018-10-02 MED ORDER — SENNOSIDES-DOCUSATE SODIUM 8.6-50 MG PO TABS
2.0000 | ORAL_TABLET | ORAL | Status: DC
  Administered 2018-10-03: 2 via ORAL
  Filled 2018-10-02: qty 2

## 2018-10-02 MED ORDER — ACETAMINOPHEN 325 MG PO TABS
650.0000 mg | ORAL_TABLET | ORAL | Status: DC | PRN
  Administered 2018-10-02 – 2018-10-03 (×2): 650 mg via ORAL
  Filled 2018-10-02 (×2): qty 2

## 2018-10-02 MED ORDER — ONDANSETRON HCL 4 MG/2ML IJ SOLN: 4.0000 mg | INTRAMUSCULAR | Status: DC | PRN

## 2018-10-02 MED ORDER — SIMETHICONE 80 MG PO CHEW: 80.0000 mg | CHEWABLE_TABLET | ORAL | Status: DC | PRN

## 2018-10-02 MED ORDER — INFLUENZA VAC SPLIT QUAD 0.5 ML IM SUSY
0.5000 mL | PREFILLED_SYRINGE | INTRAMUSCULAR | Status: AC
  Administered 2018-10-03: 0.5 mL via INTRAMUSCULAR
  Filled 2018-10-02: qty 0.5

## 2018-10-02 MED ORDER — PRENATAL MULTIVITAMIN CH
1.0000 | ORAL_TABLET | Freq: Every day | ORAL | Status: DC
  Administered 2018-10-02 – 2018-10-03 (×2): 1 via ORAL
  Filled 2018-10-02 (×2): qty 1

## 2018-10-02 MED ORDER — BENZOCAINE-MENTHOL 20-0.5 % EX AERO
1.0000 "application " | INHALATION_SPRAY | CUTANEOUS | Status: DC | PRN
  Administered 2018-10-02 – 2018-10-03 (×2): 1 via TOPICAL
  Filled 2018-10-02 (×2): qty 56

## 2018-10-02 NOTE — Progress Notes (Signed)
MOB was referred for history of anxiety and/or depression.  * Referral screened out by Clinical Social Worker because none of the following criteria appear to apply:  ~ History of anxiety/depression during this pregnancy, or of post-partum depression following prior delivery. ~ Diagnosis of anxiety and/or depression within last 3 years. Per chart review, anxiety and depression date back to 2015. No concerns noted in prenatal care records. OR * MOB's symptoms currently being treated with medication and/or therapy.  Please contact the Clinical Social Worker if needs arise, by MOB request, or if MOB scores greater than 9/yes to question 10 on Edinburgh Postpartum Depression Screen.  [name], LCSWA  [facility] [phone]  

## 2018-10-02 NOTE — Lactation Note (Signed)
This note was copied from a baby's chart. Lactation Consultation Note Baby 2 hrs old. Sleepy. Mom tired also. Mom stated that her breast has been leaking colostrum. 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 Mom knows to hold baby STS, hand express, and I&O. Mom will call out when baby is interested in feeding. Mom knows to attempt every 3 hrs and w/cues. Lactation brochure given. Encouraged to call for assistance.  Patient Name: [name] [id number] Date: 10/02/2018 Reason for consult: Initial assessment;Primapara;Early term 37-38.6wks   Maternal Data Has patient been taught Hand Expression?: Yes Does the patient have breastfeeding experience prior to this delivery?: No  Feeding    LATCH Score Latch: Too sleepy or reluctant, no latch achieved, no sucking elicited.                 Interventions    Lactation Tools Discussed/Used WIC Program: No   Consult Status Consult Status: Follow-up Date: 10/02/18 Follow-up type: In-patient    Theodoro Kalata 10/02/2018, 5:57 AM

## 2018-10-02 NOTE — Anesthesia Postprocedure Evaluation (Signed)
Anesthesia Post Note  Patient: [name]  Procedure(s) Performed: AN AD [place]     Patient location during evaluation: Mother Baby Anesthesia Type: Epidural Level of consciousness: awake and alert and oriented Pain management: satisfactory to patient Vital Signs Assessment: post-procedure vital signs reviewed and stable Respiratory status: respiratory function stable Cardiovascular status: stable Postop Assessment: no headache, no backache, epidural receding, patient able to bend at knees, no signs of nausea or vomiting and adequate PO intake Anesthetic complications: no    Last Vitals:  Vitals:   10/02/18 0639 10/02/18 1118  BP: 137/84 122/89  Pulse: 89 87  Resp: 18 18  Temp: 36.7 C 36.7 C  SpO2: 98% 98%    Last Pain:  Vitals:   10/02/18 1119  TempSrc:   PainSc: 0-No pain   Pain Goal:                   [name]

## 2018-10-03 LAB — CBC
HCT: 29.3 % — ABNORMAL LOW (ref 36.0–46.0)
Hemoglobin: 9.4 g/dL — ABNORMAL LOW (ref 12.0–15.0)
MCH: 26.9 pg (ref 26.0–34.0)
MCHC: 32.1 g/dL (ref 30.0–36.0)
MCV: 84 fL (ref 80.0–100.0)
Platelets: 228 10*3/uL (ref 150–400)
RBC: 3.49 MIL/uL — ABNORMAL LOW (ref 3.87–5.11)
RDW: 13.8 % (ref 11.5–15.5)
WBC: 11.8 10*3/uL — ABNORMAL HIGH (ref 4.0–10.5)
nRBC: 0 % (ref 0.0–0.2)

## 2018-10-03 LAB — SURGICAL PATHOLOGY

## 2018-10-03 MED ORDER — FERROUS SULFATE 325 (65 FE) MG PO TABS: 325.0000 mg | ORAL_TABLET | Freq: Two times a day (BID) | ORAL | 3 refills | Status: AC

## 2018-10-03 MED ORDER — IBUPROFEN 600 MG PO TABS: 600.0000 mg | ORAL_TABLET | Freq: Four times a day (QID) | ORAL | 0 refills | Status: DC

## 2018-10-03 NOTE — Progress Notes (Addendum)
PPD #1, SVD, left periurethral and right labial repair, baby boy "[name]"  S:  Reports feeling good, much better than yesterday  Denies HA, visual changes, RUQ/epigastric pain              Tolerating po/ No nausea or vomiting / Denies dizziness or SOB             Bleeding is light             Pain controlled with Motrin and TYlenol             Up ad lib / ambulatory / voiding QS  Newborn breast feeding - going well; seeing good colostrum  / Circumcision - completed today   O:               VS: BP 132/86 (BP Location: Left Arm)   Pulse 70   Temp 98.1 F (36.7 C) (Oral)   Resp 18   Ht 5\' 9"  (1.753 m)   Wt 125.2 kg   LMP 01/09/2018   SpO2 99%   Breastfeeding Unknown   BMI 40.76 kg/m  10/03/18 0543  98.1 F (36.7 C)  70  -  18  132/86  Sitting  99 %  -  - TR   10/02/18 22:12:06  98.5 F (36.9 C)  77  -  16  111/71  Sitting  99 %  Room Air  - AW   10/02/18 1439  98 F (36.7 C)  78  -  20  115/78  Semi-fowlers  100 %  -  - CH   10/02/18 1118  98.1 F (36.7 C)  87  -  18  122/89  Semi-fowlers  98 %  -  - MC   10/02/18 0639  98 F (36.7 C)  89  -  18  137/84  Semi-fowlers  98 %  -  - CT   10/02/18 0600  97.9 F (36.6 C)  98  -  18  125/84  Semi-fowlers  98 %  -  - CT   10/02/18 0500  -  77  -  18  128/75  -  -  -  - TB   10/02/18 0445  -  72  -  20  122/76  -  -  -  - TB   10/02/18 0416  -  80  -  16  132/78  -  -  -  - TB   10/02/18 0400  -  76  -  18  139/90  -  -  -  - TB   10/02/18 0330  -  76  -  -  130/76  -  -  -  - TB   10/02/18 0256  -  -  -  -  -  -  100 %  -  - TB   10/02/18 0245  99 F (37.2 C)  -  -  -  -  -  -  -  - TB   10/02/18 0230  -  79  -  20  133/85  -  -  -  - TB   10/02/18 0200  -  79  -  18  132/87  -  -  -  - TB   10/02/18 0132  98.7 F (37.1 C)  -  -  -  -  -  -  -  - TB      LABS:  Recent Labs    10/02/18 0408 10/03/18 0513  WBC 13.2* 11.8*  HGB 11.0* 9.4*  PLT 196 228               Blood type: --/--/A POS, A POS Performed at  [facility], [place] [address] [place], [place] [address]  [phone]/22 1045)  Rubella: Immune (09/22 0000)                     I&O: Intake/Output      09/23 0701 - 09/24 0700 09/24 0701 - 09/25 0700   I.V. (mL/kg)     Other     IV Piggyback     Total Intake(mL/kg)     Urine (mL/kg/hr)     Blood     Total Output     Net                        Physical Exam:             Alert and oriented X3  Lungs: Clear and unlabored  Heart: regular rate and rhythm / no murmurs  Abdomen: soft, non-tender, non-distended              Fundus: firm, non-tender, U-1  Perineum: intact; mild labial edema noted; no erythema or ecchymosis   Lochia: small, no clots   Extremities: +1 LE edema, no calf pain or tenderness    A/P: PPD # 1, SVD  Gestational Hypertension - not on meds   - BPs normal since delivery    - Labs stable   - No neural s/s, no evidence of PEC   Left Periurethral and right Labial minora repair   ABL Anemia    - stable, on oral FE   Doing well - stable status  Routine post partum orders  Continue working with lactation   Anticipate d/c home tomorrow   Lars Pinks, MSN, CNM [facility]   Addendum: notified by RN at [phone] that patient requests an early d/c.  I returned the call at 3:30pm due to being in a surgery.  D/C home today. [facility] discharge book given and instructions and warning s/s reviewed. Repeat BP check in 1 week and call office with results.  PEC warning s/s reviewed.  F/u with Dr. Garwin Brothers in 6 weeks.  Lars Pinks, CNM

## 2018-10-03 NOTE — Lactation Note (Signed)
This note was copied from a baby's chart. Lactation Consultation Note  Patient Name: [name] [id number] Date: 10/03/2018   Early-term infant at 102 hrs of age s/p circ. Mom has been leaking since [redacted] weeks gestation. Infant is feeding well. I did recommend doing breast compressions prn to keep infant engaged in feeding.   Mom has a Spectra pump at home.   Matthias Hughs [facility] 10/03/2018, 1:38 PM

## 2018-10-03 NOTE — Discharge Summary (Signed)
Obstetric Discharge Summary   Patient Name: [name] DOB: 1985-12-30 MRN: [id number]  Date of Admission: 10/01/2018 Date of Discharge: 10/03/2018 Date of Delivery: 10/02/2018 Gestational Age at Delivery: [redacted]w[redacted]d  Primary OB: [facility] - Dr. Garwin Brothers  Antepartum complications:  - Gestational hypertension  - GERD - Asthma - GBS positive  Prenatal Labs:  ABO, Rh: --/--/A POS, A POS Performed at [facility], [place] [address] [place], [place] [address]  [phone]/22 1045) Antibody: NEG (09/22 1045) Rubella:  Immune RPR: NON REACTIVE (09/22 1043)  HBsAg:   neg HIV:   neg GBS:   positive Admitting Diagnosis: IOL at 37+6 weeks for GHTN  Secondary Diagnoses: Patient Active Problem List   Diagnosis Date Noted  . Postpartum care following vaginal delivery (9/23) 10/02/2018  . SVD (spontaneous vaginal delivery) 10/02/2018  . Encounter for induction of labor 10/01/2018  . Chronic asthma, mild persistent, uncomplicated 62/22/9798   Induction: Intracervical balloon, Pitocin Augmentation: None Complications: None  Date of Delivery: 10/02/2018 Delivered By: Dr. Garwin Brothers Delivery Type: spontaneous vaginal delivery Anesthesia: epidural Placenta: spontaneous Laceration: left periurethral and right labial  Episiotomy: none  Newborn Data: Live born female  Birth Weight: 8 lb 1.6 oz (3674 g) APGAR: 8, 9  Newborn Delivery   Birth date/time: 10/02/2018 03:19:00 Delivery type: Vaginal, Spontaneous      Hospital/Postpartum Course  (Vaginal Delivery): Pt. Admitted at 37+6 weeks for IOL secondary to gestational hypertension.  She progressed with intracervical balloon and Pitocin.  Her amniotic sac spontaneously ruptured.  She delivered by NSVD.  See notes and delivery summary and report for details. Patient had an uncomplicated postpartum course.  By time of discharge on PPD#1, her pain was controlled on oral pain medications; she had appropriate lochia and was ambulating,  voiding without difficulty and tolerating regular diet.  She was deemed stable for discharge to home.     Labs: CBC Latest Ref Rng & Units 10/03/2018 10/02/2018 10/01/2018  WBC 4.0 - 10.5 K/uL 11.8(H) 13.2(H) 8.7  Hemoglobin 12.0 - 15.0 g/dL 9.4(L) 11.0(L) 11.5(L)  Hematocrit 36.0 - 46.0 % 29.3(L) 34.0(L) 35.2(L)  Platelets 150 - 400 K/uL 921 194 174   Conflict (See Lab Report): A POS/A POS Performed at [facility], [place] [address], [place],  [address]   Physical exam:  BP 132/86 (BP Location: Left Arm)   Pulse 70   Temp 98.1 F (36.7 C) (Oral)   Resp 18   Ht 5\' 9"  (1.753 m)   Wt 125.2 kg   LMP 01/09/2018   SpO2 99%   Breastfeeding Unknown   BMI 40.76 kg/m  Alert and oriented X3             Lungs: Clear and unlabored             Heart: regular rate and rhythm / no murmurs             Abdomen: soft, non-tender, non-distended              Fundus: firm, non-tender, U-1             Perineum: intact; mild labial edema noted; no erythema or ecchymosis              Lochia: small, no clots              Extremities: +1 LE edema, no calf pain or tenderness   Disposition: stable, discharge to home Baby Feeding: breast milk Baby Disposition: home with mom  Rh Immune globulin given: N/A Rubella vaccine given: N/A Tdap vaccine given in AP or PP setting: UTD Flu vaccine given in AP or PP setting: given 10/03/2018   Plan:  [name] was discharged to home in good condition. Follow-up appointment at [facility] in 1 week for BP check, then 6 week PP visit.   Discharge Instructions: Per After Visit Summary. Refer to After Visit Summary and [facility] discharge booklet  Activity: Advance as tolerated. Pelvic rest for 6 weeks.   Diet: Regular, Heart Healthy Discharge Medications: Allergies as of 10/03/2018   No Known Allergies     Medication List    TAKE these medications   ferrous sulfate 325 (65 FE) MG tablet Take 1 tablet (325 mg total) by  mouth 2 (two) times daily with a meal.   ibuprofen 600 MG tablet Commonly known as: ADVIL Take 1 tablet (600 mg total) by mouth every 6 (six) hours.   multivitamin-prenatal 27-0.8 MG Tabs tablet Take 1 tablet by mouth daily at 12 noon.      Outpatient follow up:  Follow-up Information    Maxie Better, MD. Schedule an appointment as soon as possible for a visit in 1 week(s).   Specialty: Obstetrics and Gynecology Why: BP check, then 6 week PP visit Contact information: [address] [place] [facility] [address] [phone]           Signed:  Carlean Jews, MSN, CNM [facility]

## 2018-10-03 NOTE — Lactation Note (Signed)
This note was copied from a baby's chart. Lactation Consultation Note Attempted to see mom 2 times during the night. FOB awake holding baby, mom sleeping.  Patient Name: [name] [id number] Date: 10/03/2018     Maternal Data    Feeding    LATCH Score                   Interventions    Lactation Tools Discussed/Used     Consult Status      Theodoro Kalata 10/03/2018, 5:16 AM

## 2018-10-09 ENCOUNTER — Inpatient Hospital Stay (HOSPITAL_COMMUNITY): Payer: 59

## 2019-01-10 NOTE — L&D Delivery Note (Signed)
DELIVERY NOTE  Pt complete and at +2 station with urge to push. Epidural controlling pain. Pt pushed and delivered a viable female infant in ROA position. Anterior and posterior shoulders spontaneously delivered with next two pushes; body easily followed next. Infant placed on mothers abdomen and bulb suction of mouth and nose performed. Cord was then clamped and cut by FOB. Cord blood obtained, 3VC. Baby had a vigorous spontaneous cry noted initially but then became more quiet, moved to warmer with O2 saturation was 98-100%, bulb suction performed, returned to skin to skin. Placenta then delivered at 2016 intact. Fundal massage performed and pitocin per protocol. Fundus firm. The following lacerations were noted: right labial. Repaired in routine fashion with 3-0 monocryl on a SH. Mother and baby stable. Counts correct   Infant time: 2012 Gender: female Placenta time: 2016 Apgars: 8/8 Weight: pending skin-to-skin  Patient desires circumcision for baby boy. However, upon delivery, noted to have very short shaft and smaller glans. BL testicles palpated. Reviewed that circumcision will likely be deferred given concern for denouement of shaft/scrotal skin. Patient agrees.

## 2019-01-24 ENCOUNTER — Encounter (HOSPITAL_COMMUNITY): Payer: Self-pay | Admitting: Obstetrics and Gynecology

## 2019-04-24 DIAGNOSIS — Z3201 Encounter for pregnancy test, result positive: Secondary | ICD-10-CM | POA: Diagnosis not present

## 2019-04-24 DIAGNOSIS — Z331 Pregnant state, incidental: Secondary | ICD-10-CM | POA: Diagnosis not present

## 2019-04-24 DIAGNOSIS — N911 Secondary amenorrhea: Secondary | ICD-10-CM | POA: Diagnosis not present

## 2019-04-24 DIAGNOSIS — Z7689 Persons encountering health services in other specified circumstances: Secondary | ICD-10-CM | POA: Diagnosis not present

## 2019-04-24 DIAGNOSIS — Z3A01 Less than 8 weeks gestation of pregnancy: Secondary | ICD-10-CM | POA: Diagnosis not present

## 2019-04-28 ENCOUNTER — Ambulatory Visit: Payer: 59 | Admitting: Family

## 2019-04-28 DIAGNOSIS — Z331 Pregnant state, incidental: Secondary | ICD-10-CM | POA: Diagnosis not present

## 2019-04-28 DIAGNOSIS — Z7689 Persons encountering health services in other specified circumstances: Secondary | ICD-10-CM | POA: Diagnosis not present

## 2019-05-01 DIAGNOSIS — Z3A01 Less than 8 weeks gestation of pregnancy: Secondary | ICD-10-CM | POA: Diagnosis not present

## 2019-05-01 DIAGNOSIS — O3680X0 Pregnancy with inconclusive fetal viability, not applicable or unspecified: Secondary | ICD-10-CM | POA: Diagnosis not present

## 2019-05-11 ENCOUNTER — Encounter (HOSPITAL_COMMUNITY): Payer: Self-pay | Admitting: Obstetrics and Gynecology

## 2019-05-11 ENCOUNTER — Inpatient Hospital Stay (HOSPITAL_COMMUNITY): Payer: 59

## 2019-05-11 ENCOUNTER — Other Ambulatory Visit: Payer: Self-pay

## 2019-05-11 ENCOUNTER — Inpatient Hospital Stay (HOSPITAL_COMMUNITY)
Admission: AD | Admit: 2019-05-11 | Discharge: 2019-05-11 | Disposition: A | Discharge status: Home / Self Care | Payer: 59 | Attending: Obstetrics and Gynecology | Admitting: Obstetrics and Gynecology

## 2019-05-11 DIAGNOSIS — R109 Unspecified abdominal pain: Secondary | ICD-10-CM

## 2019-05-11 DIAGNOSIS — Z3A01 Less than 8 weeks gestation of pregnancy: Secondary | ICD-10-CM

## 2019-05-11 DIAGNOSIS — O26851 Spotting complicating pregnancy, first trimester: Secondary | ICD-10-CM | POA: Diagnosis not present

## 2019-05-11 DIAGNOSIS — O208 Other hemorrhage in early pregnancy: Secondary | ICD-10-CM | POA: Insufficient documentation

## 2019-05-11 DIAGNOSIS — O26859 Spotting complicating pregnancy, unspecified trimester: Secondary | ICD-10-CM

## 2019-05-11 DIAGNOSIS — O468X1 Other antepartum hemorrhage, first trimester: Secondary | ICD-10-CM

## 2019-05-11 DIAGNOSIS — O26891 Other specified pregnancy related conditions, first trimester: Secondary | ICD-10-CM

## 2019-05-11 DIAGNOSIS — R1030 Lower abdominal pain, unspecified: Secondary | ICD-10-CM | POA: Diagnosis not present

## 2019-05-11 DIAGNOSIS — O418X10 Other specified disorders of amniotic fluid and membranes, first trimester, not applicable or unspecified: Secondary | ICD-10-CM | POA: Diagnosis present

## 2019-05-11 LAB — URINALYSIS, ROUTINE W REFLEX MICROSCOPIC
Bacteria, UA: NONE SEEN
Bilirubin Urine: NEGATIVE
Glucose, UA: NEGATIVE mg/dL
Hgb urine dipstick: NEGATIVE
Ketones, ur: NEGATIVE mg/dL
Nitrite: NEGATIVE
Protein, ur: NEGATIVE mg/dL
Specific Gravity, Urine: 1.006 (ref 1.005–1.030)
pH: 6 (ref 5.0–8.0)

## 2019-05-11 LAB — CBC
HCT: 39.7 % (ref 36.0–46.0)
Hemoglobin: 13 g/dL (ref 12.0–15.0)
MCH: 28.2 pg (ref 26.0–34.0)
MCHC: 32.7 g/dL (ref 30.0–36.0)
MCV: 86.1 fL (ref 80.0–100.0)
Platelets: 358 10*3/uL (ref 150–400)
RBC: 4.61 MIL/uL (ref 3.87–5.11)
RDW: 13.1 % (ref 11.5–15.5)
WBC: 8.1 10*3/uL (ref 4.0–10.5)
nRBC: 0 % (ref 0.0–0.2)

## 2019-05-11 LAB — HCG, QUANTITATIVE, PREGNANCY: hCG, Beta Chain, Quant, S: 55191 m[IU]/mL — ABNORMAL HIGH (ref <5)

## 2019-05-11 LAB — WET PREP, GENITAL
Clue Cells Wet Prep HPF POC: NONE SEEN
Sperm: NONE SEEN
Trich, Wet Prep: NONE SEEN
Yeast Wet Prep HPF POC: NONE SEEN

## 2019-05-11 LAB — POCT PREGNANCY, URINE: Preg Test, Ur: POSITIVE — AB

## 2019-05-11 NOTE — MAU Provider Note (Signed)
History     CSN: [id number]  Arrival date and time: 05/11/19 1933   First Provider Initiated Contact with Patient 05/11/19 2049      Chief Complaint  Patient presents with  . Abdominal Pain  . Vaginal Bleeding   Ms.  [name] is a 34 y.o. year old G72P1001 female at [redacted]w[redacted]d weeks gestation by LMP who presents to MAU reporting lower abdominal pain/cramping, lower back pain/cramping and vaginal spotting throughout the day. 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 She denies any recent SI. She receives [facility] with [facility] and had a viable IUP with (+) YS 2 wks ago.   OB History    Gravida  2   Para  1   Term  1   Preterm      AB      Living  1     SAB      TAB      Ectopic      Multiple  0   Live Births  1           Past Medical History:  Diagnosis Date  . Asthma     Past Surgical History:  Procedure Laterality Date  . WRIST SURGERY      Family History  Problem Relation Age of Onset  . Asthma Mother     Social History   Tobacco Use  . Smoking status: Never Smoker  . Smokeless tobacco: Never Used  Substance Use Topics  . Alcohol use: Not Currently  . Drug use: Never    Allergies: No Known Allergies  Medications Prior to Admission  Medication Sig Dispense Refill Last Dose  . FLUoxetine (PROZAC) 20 MG tablet Take 20 mg by mouth daily.     . ferrous sulfate 325 (65 FE) MG tablet Take 1 tablet (325 mg total) by mouth 2 (two) times daily with a meal. 60 tablet 3   . ibuprofen (ADVIL) 600 MG tablet Take 1 tablet (600 mg total) by mouth every 6 (six) hours. 30 tablet 0   . Prenatal Vit-Fe Fumarate-FA (MULTIVITAMIN-PRENATAL) 27-0.8 MG TABS tablet Take 1 tablet by mouth daily at 12 noon.       Review of Systems  Constitutional: Negative.   HENT: Negative.   Eyes: Negative.   Respiratory: Negative.   Cardiovascular: Negative.   Gastrointestinal: Negative.   Endocrine: Negative.   Genitourinary: Positive for pelvic pain  (some cramping all day) and vaginal bleeding (spotting throughout the day).  Musculoskeletal: Positive for back pain (lower).  Skin: Negative.   Allergic/Immunologic: Negative.   Neurological: Negative.   Hematological: Negative.   Psychiatric/Behavioral: Negative.    Physical Exam   Blood pressure 130/70, pulse 84, temperature 98.7 F (37.1 C), temperature source Oral, resp. rate 18, weight 115.4 kg  Physical Exam  Nursing note and vitals reviewed. Constitutional: She is oriented to person, place, and time. She appears well-developed and well-nourished.  HENT:  Head: Normocephalic and atraumatic.  Eyes: Pupils are equal, round, and reactive to light.  Cardiovascular: Normal rate.  Respiratory: Effort normal.  GI: Soft.  Genitourinary:    Genitourinary Comments: Uterus: non-tender, SE: cervix is smooth, pink, no lesions, small amt of thick, white vaginal d/c -- WP, GC/CT done, closed/long/firm, no CMT, small amt of friability with GC/CT swab, no adnexal tenderness    Musculoskeletal:        General: Normal range of motion.     Cervical back: Normal range of motion.  Neurological: She is alert and oriented to person, place, and time.  Skin: Skin is warm and dry.  Psychiatric: She has a normal mood and affect. Her behavior is normal. Judgment and thought content normal.    MAU Course  Procedures  MDM CCUA UPT CBC ABO/Rh-- not drawn, known A Pos HCG Wet Prep GC/CT -- pending HIV -- pending OB < 14 wks [place] with TV  Results for orders placed or performed during the hospital encounter of 05/11/19 (from the past 24 hour(s))  Urinalysis, Routine w reflex microscopic     Status: Abnormal   Collection Time: 05/11/19  7:59 PM  Result Value Ref Range   Color, Urine STRAW (A) YELLOW   APPearance CLEAR CLEAR   Specific Gravity, Urine 1.006 1.005 - 1.030   pH 6.0 5.0 - 8.0   Glucose, UA NEGATIVE NEGATIVE mg/dL   Hgb urine dipstick NEGATIVE NEGATIVE   Bilirubin Urine NEGATIVE  NEGATIVE   Ketones, ur NEGATIVE NEGATIVE mg/dL   Protein, ur NEGATIVE NEGATIVE mg/dL   Nitrite NEGATIVE NEGATIVE   Leukocytes,Ua SMALL (A) NEGATIVE   RBC / HPF 0-5 0 - 5 RBC/hpf   WBC, UA 0-5 0 - 5 WBC/hpf   Bacteria, UA NONE SEEN NONE SEEN   Squamous Epithelial / LPF 0-5 0 - 5   Mucus PRESENT   Pregnancy, urine POC     Status: Abnormal   Collection Time: 05/11/19  8:01 PM  Result Value Ref Range   Preg Test, Ur POSITIVE (A) NEGATIVE  Wet prep, genital     Status: Abnormal   Collection Time: 05/11/19  8:57 PM   Specimen: Cervix  Result Value Ref Range   Yeast Wet Prep HPF POC NONE SEEN NONE SEEN   Trich, Wet Prep NONE SEEN NONE SEEN   Clue Cells Wet Prep HPF POC NONE SEEN NONE SEEN   WBC, Wet Prep HPF POC MANY (A) NONE SEEN   Sperm NONE SEEN   CBC     Status: None   Collection Time: 05/11/19  9:04 PM  Result Value Ref Range   WBC 8.1 4.0 - 10.5 K/uL   RBC 4.61 3.87 - 5.11 MIL/uL   Hemoglobin 13.0 12.0 - 15.0 g/dL   HCT 39.7 36.0 - 46.0 %   MCV 86.1 80.0 - 100.0 fL   MCH 28.2 26.0 - 34.0 pg   MCHC 32.7 30.0 - 36.0 g/dL   RDW 13.1 11.5 - 15.5 %   Platelets 358 150 - 400 K/uL   nRBC 0.0 0.0 - 0.2 %  hCG, quantitative, pregnancy     Status: Abnormal   Collection Time: 05/11/19  9:04 PM  Result Value Ref Range   hCG, Beta Chain, Quant, S 55,191 (H) <5 mIU/mL    US OB LESS THAN 14 WEEKS WITH OB TRANSVAGINAL  Result Date: 05/11/2019 CLINICAL DATA:  Cramping and spotting EXAM: OBSTETRIC <14 WK [place] AND TRANSVAGINAL OB US TECHNIQUE: Both transabdominal and transvaginal ultrasound examinations were performed for complete evaluation of the gestation as well as the maternal uterus, adnexal regions, and pelvic cul-de-sac. Transvaginal technique was performed to assess early pregnancy. COMPARISON:  None. FINDINGS: Intrauterine gestational sac: Single Yolk sac:  Visualized. Embryo:  Visualized. Cardiac Activity: Visualized. Heart Rate: 129 bpm CRL: 9.6 mm   7 w   0 d                  [place]  EDC: 12/28/2019 Subchorionic hemorrhage: There is a small volume of subchorionic hemorrhage.  Maternal uterus/adnexae: The ovaries are unremarkable. There is a trace amount of pelvic free fluid. IMPRESSION: Single live IUP at 7 weeks and 0 days as detailed above. Electronically Signed   By: Katherine Mantle M.D.   On: 05/11/2019 22:17     Assessment and Plan  Spotting in early pregnancy  - Information provided on spotting in pregnancy  - Return to MAU:  If you have heavier bleeding that soaks through more that 2 pads per hour for an hour or more  If you bleed so much that you feel like you might pass out or you do pass out  If you have significant abdominal pain that is not improved with Tylenol   If you develop a fever > 100.5  Abdominal pain during pregnancy in first trimester  - Information provided on abdominal pain in pregnancy   Subchorionic hematoma in first trimester, single or unspecified fetus  - Information provided on Lifescape & threatened miscarriage - Advised to be on pelvic rest until permitted by her OB provider   - Discharge patient - Keep scheduled appt with [facility] on Thursday 05/15/2019 - Patient verbalized an understanding of the plan of care and agrees.     Raelyn Mora, MSN, CNM 05/11/2019, 8:49 PM

## 2019-05-11 NOTE — Discharge Instructions (Signed)
NO SEX UNTIL PERMITTED BY OB DOCTOR!! Return to MAU:  If you have heavier bleeding that soaks through more that 2 pads per hour for an hour or more  If you bleed so much that you feel like you might pass out or you do pass out  If you have significant abdominal pain that is not improved with Tylenol   If you develop a fever > 100.5

## 2019-05-11 NOTE — MAU Note (Signed)
States she has had lower abdominal and lower back cramping all day.  Also had some spotting throughout the day.  Denies recent intercourse.  States she had an U/S 2 weeks ago and was able to see a yolk sac at that time.

## 2019-05-12 LAB — GC/CHLAMYDIA PROBE AMP (~~LOC~~) NOT AT ARMC
Chlamydia: NEGATIVE
Comment: NEGATIVE
Comment: NORMAL
Neisseria Gonorrhea: NEGATIVE

## 2019-05-12 LAB — HIV ANTIBODY (ROUTINE TESTING W REFLEX): HIV Screen 4th Generation wRfx: NONREACTIVE

## 2019-05-15 DIAGNOSIS — Z3A01 Less than 8 weeks gestation of pregnancy: Secondary | ICD-10-CM | POA: Diagnosis not present

## 2019-05-15 DIAGNOSIS — O3680X0 Pregnancy with inconclusive fetal viability, not applicable or unspecified: Secondary | ICD-10-CM | POA: Diagnosis not present

## 2019-05-27 DIAGNOSIS — R109 Unspecified abdominal pain: Secondary | ICD-10-CM | POA: Diagnosis not present

## 2019-05-27 DIAGNOSIS — Z3A09 9 weeks gestation of pregnancy: Secondary | ICD-10-CM | POA: Diagnosis not present

## 2019-05-27 DIAGNOSIS — Z368A Encounter for antenatal screening for other genetic defects: Secondary | ICD-10-CM | POA: Diagnosis not present

## 2019-05-27 DIAGNOSIS — O26891 Other specified pregnancy related conditions, first trimester: Secondary | ICD-10-CM | POA: Diagnosis not present

## 2019-05-27 DIAGNOSIS — Z113 Encounter for screening for infections with a predominantly sexual mode of transmission: Secondary | ICD-10-CM | POA: Diagnosis not present

## 2019-05-27 DIAGNOSIS — Z3689 Encounter for other specified antenatal screening: Secondary | ICD-10-CM | POA: Diagnosis not present

## 2019-05-27 DIAGNOSIS — O99211 Obesity complicating pregnancy, first trimester: Secondary | ICD-10-CM | POA: Diagnosis not present

## 2019-05-27 DIAGNOSIS — J45909 Unspecified asthma, uncomplicated: Secondary | ICD-10-CM | POA: Diagnosis not present

## 2019-05-27 DIAGNOSIS — O219 Vomiting of pregnancy, unspecified: Secondary | ICD-10-CM | POA: Diagnosis not present

## 2019-10-14 ENCOUNTER — Inpatient Hospital Stay (HOSPITAL_COMMUNITY)
Admission: AD | Admit: 2019-10-14 | Discharge: 2019-10-14 | Disposition: A | Discharge status: Home / Self Care | Payer: 59 | Attending: Obstetrics and Gynecology | Admitting: Obstetrics and Gynecology

## 2019-10-14 ENCOUNTER — Encounter (HOSPITAL_COMMUNITY): Payer: Self-pay | Admitting: Obstetrics and Gynecology

## 2019-10-14 ENCOUNTER — Other Ambulatory Visit: Payer: Self-pay

## 2019-10-14 DIAGNOSIS — O10919 Unspecified pre-existing hypertension complicating pregnancy, unspecified trimester: Secondary | ICD-10-CM

## 2019-10-14 DIAGNOSIS — E785 Hyperlipidemia, unspecified: Secondary | ICD-10-CM | POA: Insufficient documentation

## 2019-10-14 DIAGNOSIS — O99513 Diseases of the respiratory system complicating pregnancy, third trimester: Secondary | ICD-10-CM | POA: Diagnosis not present

## 2019-10-14 DIAGNOSIS — Z3A29 29 weeks gestation of pregnancy: Secondary | ICD-10-CM | POA: Diagnosis not present

## 2019-10-14 DIAGNOSIS — J45909 Unspecified asthma, uncomplicated: Secondary | ICD-10-CM | POA: Insufficient documentation

## 2019-10-14 DIAGNOSIS — R109 Unspecified abdominal pain: Secondary | ICD-10-CM | POA: Diagnosis not present

## 2019-10-14 DIAGNOSIS — O26893 Other specified pregnancy related conditions, third trimester: Secondary | ICD-10-CM | POA: Diagnosis not present

## 2019-10-14 DIAGNOSIS — R519 Headache, unspecified: Secondary | ICD-10-CM

## 2019-10-14 DIAGNOSIS — O10913 Unspecified pre-existing hypertension complicating pregnancy, third trimester: Secondary | ICD-10-CM | POA: Insufficient documentation

## 2019-10-14 DIAGNOSIS — Z79899 Other long term (current) drug therapy: Secondary | ICD-10-CM | POA: Diagnosis not present

## 2019-10-14 DIAGNOSIS — F909 Attention-deficit hyperactivity disorder, unspecified type: Secondary | ICD-10-CM | POA: Insufficient documentation

## 2019-10-14 LAB — URINALYSIS, ROUTINE W REFLEX MICROSCOPIC
Bilirubin Urine: NEGATIVE
Glucose, UA: NEGATIVE mg/dL
Hgb urine dipstick: NEGATIVE
Ketones, ur: NEGATIVE mg/dL
Nitrite: NEGATIVE
Protein, ur: NEGATIVE mg/dL
Specific Gravity, Urine: 1.004 — ABNORMAL LOW (ref 1.005–1.030)
pH: 6 (ref 5.0–8.0)

## 2019-10-14 LAB — COMPREHENSIVE METABOLIC PANEL
ALT: 17 U/L (ref 0–44)
AST: 19 U/L (ref 15–41)
Albumin: 2.9 g/dL — ABNORMAL LOW (ref 3.5–5.0)
Alkaline Phosphatase: 60 U/L (ref 38–126)
Anion gap: 10 (ref 5–15)
BUN: 5 mg/dL — ABNORMAL LOW (ref 6–20)
CO2: 22 mmol/L (ref 22–32)
Calcium: 9.1 mg/dL (ref 8.9–10.3)
Chloride: 104 mmol/L (ref 98–111)
Creatinine, Ser: 0.56 mg/dL (ref 0.44–1.00)
GFR calc non Af Amer: >60 mL/min (ref >60)
Glucose, Bld: 82 mg/dL (ref 70–99)
Potassium: 4 mmol/L (ref 3.5–5.1)
Sodium: 136 mmol/L (ref 135–145)
Total Bilirubin: 0.5 mg/dL (ref 0.3–1.2)
Total Protein: 6.5 g/dL (ref 6.5–8.1)

## 2019-10-14 LAB — CBC
HCT: 35.5 % — ABNORMAL LOW (ref 36.0–46.0)
Hemoglobin: 11 g/dL — ABNORMAL LOW (ref 12.0–15.0)
MCH: 25.8 pg — ABNORMAL LOW (ref 26.0–34.0)
MCHC: 31 g/dL (ref 30.0–36.0)
MCV: 83.1 fL (ref 80.0–100.0)
Platelets: 303 10*3/uL (ref 150–400)
RBC: 4.27 MIL/uL (ref 3.87–5.11)
RDW: 15.2 % (ref 11.5–15.5)
WBC: 10.5 10*3/uL (ref 4.0–10.5)
nRBC: 0 % (ref 0.0–0.2)

## 2019-10-14 LAB — PROTEIN / CREATININE RATIO, URINE
Creatinine, Urine: 26.3 mg/dL
Total Protein, Urine: <6 mg/dL

## 2019-10-14 MED ORDER — METOCLOPRAMIDE HCL 5 MG/ML IJ SOLN
10.0000 mg | Freq: Once | INTRAMUSCULAR | Status: AC
  Administered 2019-10-14: 10 mg via INTRAVENOUS
  Filled 2019-10-14: qty 2

## 2019-10-14 MED ORDER — DIPHENHYDRAMINE HCL 50 MG/ML IJ SOLN
25.0000 mg | Freq: Once | INTRAMUSCULAR | Status: AC
  Administered 2019-10-14: 25 mg via INTRAVENOUS
  Filled 2019-10-14: qty 1

## 2019-10-14 MED ORDER — DEXAMETHASONE SODIUM PHOSPHATE 10 MG/ML IJ SOLN
10.0000 mg | Freq: Once | INTRAMUSCULAR | Status: AC
  Administered 2019-10-14: 10 mg via INTRAVENOUS
  Filled 2019-10-14: qty 1

## 2019-10-14 MED ORDER — LACTATED RINGERS IV BOLUS
1000.0000 mL | Freq: Once | INTRAVENOUS | Status: AC
  Administered 2019-10-14: 1000 mL via INTRAVENOUS

## 2019-10-14 MED ORDER — METOCLOPRAMIDE HCL 10 MG PO TABS: 10.0000 mg | ORAL_TABLET | Freq: Three times a day (TID) | ORAL | 0 refills | Status: AC | PRN

## 2019-10-14 NOTE — MAU Note (Signed)
Presents with c/o H/A unrelieved with Tylenol, dizziness, and visual disturbances. Reports intermittent epigastric pain. Reports took BP @ home and BP "140's over 90's".  Denies VB or LOF.  Endorses +FM.

## 2019-10-14 NOTE — Discharge Instructions (Signed)

## 2019-10-14 NOTE — MAU Provider Note (Signed)
History     CSN: [id number]  Arrival date and time: 10/14/19 7341   First Provider Initiated Contact with Patient 10/14/19 1944      Chief Complaint  Patient presents with  . Headache  . BP Evaluation   [name] is a 34 y.o. G2P1001 at [redacted]w[redacted]d who presents with headache & hypertension. Has had tylenol since last night. Took tylenol earlier today without relief. Has had some blurred vision with her headache. Has been taking her BP at home & reports that has been consistently elevated today. Patient unclear if she has bp issues outside of pregnancy. Has had some elevated BPs in the office but unsure of how high or when. Denies fever, sore throat, chest pain, SOB, or cough. Denies contractions, LOF, or vaginal bleeding. Good fetal movement. Goes to [facility] ob/gyn for prenatal care.   Location: head Quality: aching Severity: 8/10 on pain scale Duration: day Timing: constant Modifying factors: none Associated signs and symptoms: blurred vision, hypertension     OB History    Gravida  2   Para  1   Term  1   Preterm      AB      Living  1     SAB      TAB      Ectopic      Multiple  0   Live Births  1           Past Medical History:  Diagnosis Date  . Asthma     Past Surgical History:  Procedure Laterality Date  . WRIST SURGERY      Family History  Problem Relation Age of Onset  . Asthma Mother     Social History   Tobacco Use  . Smoking status: Never Smoker  . Smokeless tobacco: Never Used  Vaping Use  . Vaping Use: Never used  Substance Use Topics  . Alcohol use: Not Currently  . Drug use: Never    Allergies:  Allergies  Allergen Reactions  . Other Anaphylaxis    Tree nuts-  Macadamia nuts anaphylaxis other reactions depend on the nut      Medications Prior to Admission  Medication Sig Dispense Refill Last Dose  . ferrous sulfate 325 (65 FE) MG tablet Take 1 tablet (325 mg total) by mouth 2 (two) times daily with  a meal. 60 tablet 3 10/13/2019 at Unknown time  . FLUoxetine (PROZAC) 20 MG tablet Take 20 mg by mouth daily.   10/14/2019 at Unknown time  . pantoprazole (PROTONIX) 40 MG tablet Take 40 mg by mouth daily.   10/14/2019 at Unknown time  . Prenatal Vit-Fe Fumarate-FA (MULTIVITAMIN-PRENATAL) 27-0.8 MG TABS tablet Take 1 tablet by mouth daily at 12 noon.   10/14/2019 at Unknown time  . albuterol (VENTOLIN HFA) 108 (90 Base) MCG/ACT inhaler Inhale 2 puffs into the lungs every 4 (four) hours.       Review of Systems  Constitutional: Negative.   Eyes: Positive for visual disturbance. Negative for photophobia.  Respiratory: Negative.   Gastrointestinal: Negative.   Genitourinary: Negative.   Neurological: Positive for headaches.   Physical Exam   Blood pressure 113/68, pulse 79, temperature 98.2 F (36.8 C), temperature source Oral, resp. rate 20, height 5\' 9"  (1.753 m), weight 128.9 kg, SpO2 99 %, unknown if currently breastfeeding. Patient Vitals for the past 24 hrs:  BP Temp Temp src Pulse Resp SpO2 Height Weight  10/14/19 2201 113/68 -- -- 79 -- -- -- --  10/14/19 2146 119/65 -- -- 77 -- -- -- --  10/14/19 2131 (!) 121/59 -- -- 77 -- -- -- --  10/14/19 2115 140/74 -- -- 84 -- -- -- --  10/14/19 2100 129/82 -- -- 80 -- -- -- --  10/14/19 2051 121/74 -- -- 77 -- -- -- --  10/14/19 2001 (!) 133/92 -- -- 87 -- -- -- --  10/14/19 1946 117/78 -- -- 80 -- -- -- --  10/14/19 1931 128/84 -- -- 85 -- -- -- --  10/14/19 1915 129/77 -- -- 80 -- 99 % -- --  10/14/19 1859 131/88 98.2 F (36.8 C) Oral 92 20 100 % -- --  10/14/19 1854 -- -- -- -- -- -- 5\' 9"  (1.753 m) 128.9 kg    Physical Exam Vitals and nursing note reviewed.  Constitutional:      General: She is not in acute distress.    Appearance: She is well-developed.  HENT:     Head: Normocephalic and atraumatic.  Cardiovascular:     Rate and Rhythm: Normal rate and regular rhythm.     Heart sounds: Normal heart sounds.  Pulmonary:      Effort: Pulmonary effort is normal. No respiratory distress.     Breath sounds: Normal breath sounds.  Abdominal:     Tenderness: There is no abdominal tenderness.     Comments: Gravid uterus  Musculoskeletal:     Right lower leg: 2+ Pitting Edema present.     Left lower leg: 2+ Pitting Edema present.  Neurological:     Mental Status: She is alert.     Deep Tendon Reflexes:     Reflex Scores:      Patellar reflexes are 2+ on the right side and 2+ on the left side.    Comments: No clonus  Psychiatric:        Mood and Affect: Mood normal.        Speech: Speech normal.        Behavior: Behavior normal.    Fetal Tracing:  Baseline: 140 Variability: moderate Accelerations: 10x10 Decelerations: none  Toco: none   MAU Course  Procedures Results for orders placed or performed during the hospital encounter of 10/14/19 (from the past 24 hour(s))  Urinalysis, Routine w reflex microscopic Urine, Clean Catch     Status: Abnormal   Collection Time: 10/14/19  7:40 PM  Result Value Ref Range   Color, Urine STRAW (A) YELLOW   APPearance CLEAR CLEAR   Specific Gravity, Urine 1.004 (L) 1.005 - 1.030   pH 6.0 5.0 - 8.0   Glucose, UA NEGATIVE NEGATIVE mg/dL   Hgb urine dipstick NEGATIVE NEGATIVE   Bilirubin Urine NEGATIVE NEGATIVE   Ketones, ur NEGATIVE NEGATIVE mg/dL   Protein, ur NEGATIVE NEGATIVE mg/dL   Nitrite NEGATIVE NEGATIVE   Leukocytes,Ua TRACE (A) NEGATIVE   RBC / HPF 0-5 0 - 5 RBC/hpf   WBC, UA 0-5 0 - 5 WBC/hpf   Bacteria, UA RARE (A) NONE SEEN   Squamous Epithelial / LPF 0-5 0 - 5  CBC     Status: Abnormal   Collection Time: 10/14/19  8:04 PM  Result Value Ref Range   WBC 10.5 4.0 - 10.5 K/uL   RBC 4.27 3.87 - 5.11 MIL/uL   Hemoglobin 11.0 (L) 12.0 - 15.0 g/dL   HCT 12/14/19 (L) 36 - 46 %   MCV 83.1 80.0 - 100.0 fL   MCH 25.8 (L) 26.0 - 34.0 pg   MCHC 31.0  30.0 - 36.0 g/dL   RDW 46.6 59.9 - 35.7 %   Platelets 303 150 - 400 K/uL   nRBC 0.0 0.0 - 0.2 %   Comprehensive metabolic panel     Status: Abnormal   Collection Time: 10/14/19  8:04 PM  Result Value Ref Range   Sodium 136 135 - 145 mmol/L   Potassium 4.0 3.5 - 5.1 mmol/L   Chloride 104 98 - 111 mmol/L   CO2 22 22 - 32 mmol/L   Glucose, Bld 82 70 - 99 mg/dL   BUN 5 (L) 6 - 20 mg/dL   Creatinine, Ser 0.17 0.44 - 1.00 mg/dL   Calcium 9.1 8.9 - 79.3 mg/dL   Total Protein 6.5 6.5 - 8.1 g/dL   Albumin 2.9 (L) 3.5 - 5.0 g/dL   AST 19 15 - 41 U/L   ALT 17 0 - 44 U/L   Alkaline Phosphatase 60 38 - 126 U/L   Total Bilirubin 0.5 0.3 - 1.2 mg/dL   GFR calc non Af Amer >60 >60 mL/min   Anion gap 10 5 - 15  Protein / creatinine ratio, urine     Status: None   Collection Time: 10/14/19  8:04 PM  Result Value Ref Range   Creatinine, Urine 26.30 mg/dL   Total Protein, Urine <6 mg/dL   Protein Creatinine Ratio        0.00 - 0.15 mg/mg[Cre]    MDM Patient presents with headache & hypertension. Per review of prenatal record; had elevated BP at 14 & 21 wk visits. Likely chronic hypertension but being followed closely in the office. Headache relieved with IV headache cocktail today & preeclampsia labs are normal. Had 2 mildly elevated BPs in MAU. No evidence of preeclampsia at this time.   Assessment and Plan   1. Chronic hypertension during pregnancy  -based on review of prenatal records, patient is likely chronic hypertension. No evidence of preeclampsia tonight. Reviewed preeclampsia signs/symptoms  2. [redacted] weeks gestation of pregnancy   3. Pregnancy headache in third trimester  -headache down to 1/10 after IV headache cocktail. Will rx reglan to take with tylenol for future headaches     Judeth Horn 10/14/2019, 10:12 PM

## 2019-12-01 LAB — OB RESULTS CONSOLE GBS: GBS: NEGATIVE

## 2019-12-08 ENCOUNTER — Other Ambulatory Visit: Payer: Self-pay

## 2019-12-08 ENCOUNTER — Encounter (HOSPITAL_COMMUNITY): Payer: Self-pay | Admitting: Obstetrics and Gynecology

## 2019-12-08 ENCOUNTER — Inpatient Hospital Stay (EMERGENCY_DEPARTMENT_HOSPITAL)
Admission: AD | Admit: 2019-12-08 | Discharge: 2019-12-08 | Disposition: A | Discharge status: Home / Self Care | Payer: 59 | Source: Home / Self Care | Attending: Obstetrics and Gynecology | Admitting: Obstetrics and Gynecology

## 2019-12-08 DIAGNOSIS — G44229 Chronic tension-type headache, not intractable: Secondary | ICD-10-CM | POA: Insufficient documentation

## 2019-12-08 DIAGNOSIS — Z3A37 37 weeks gestation of pregnancy: Secondary | ICD-10-CM | POA: Insufficient documentation

## 2019-12-08 DIAGNOSIS — R030 Elevated blood-pressure reading, without diagnosis of hypertension: Secondary | ICD-10-CM | POA: Diagnosis not present

## 2019-12-08 DIAGNOSIS — G44221 Chronic tension-type headache, intractable: Secondary | ICD-10-CM

## 2019-12-08 DIAGNOSIS — O99353 Diseases of the nervous system complicating pregnancy, third trimester: Secondary | ICD-10-CM

## 2019-12-08 DIAGNOSIS — G44201 Tension-type headache, unspecified, intractable: Secondary | ICD-10-CM

## 2019-12-08 DIAGNOSIS — O134 Gestational [pregnancy-induced] hypertension without significant proteinuria, complicating childbirth: Secondary | ICD-10-CM | POA: Diagnosis not present

## 2019-12-08 DIAGNOSIS — Z3689 Encounter for other specified antenatal screening: Secondary | ICD-10-CM

## 2019-12-08 LAB — COMPREHENSIVE METABOLIC PANEL
ALT: 16 U/L (ref 0–44)
AST: 19 U/L (ref 15–41)
Albumin: 2.7 g/dL — ABNORMAL LOW (ref 3.5–5.0)
Alkaline Phosphatase: 75 U/L (ref 38–126)
Anion gap: 9 (ref 5–15)
BUN: <5 mg/dL — ABNORMAL LOW (ref 6–20)
CO2: 21 mmol/L — ABNORMAL LOW (ref 22–32)
Calcium: 9 mg/dL (ref 8.9–10.3)
Chloride: 105 mmol/L (ref 98–111)
Creatinine, Ser: 0.64 mg/dL (ref 0.44–1.00)
GFR, Estimated: >60 mL/min (ref >60)
Glucose, Bld: 87 mg/dL (ref 70–99)
Potassium: 3.9 mmol/L (ref 3.5–5.1)
Sodium: 135 mmol/L (ref 135–145)
Total Bilirubin: 0.3 mg/dL (ref 0.3–1.2)
Total Protein: 6.3 g/dL — ABNORMAL LOW (ref 6.5–8.1)

## 2019-12-08 LAB — URINALYSIS, ROUTINE W REFLEX MICROSCOPIC
Bilirubin Urine: NEGATIVE
Glucose, UA: NEGATIVE mg/dL
Hgb urine dipstick: NEGATIVE
Ketones, ur: NEGATIVE mg/dL
Nitrite: NEGATIVE
Protein, ur: NEGATIVE mg/dL
Specific Gravity, Urine: 1.008 (ref 1.005–1.030)
pH: 6 (ref 5.0–8.0)

## 2019-12-08 LAB — PROTEIN / CREATININE RATIO, URINE
Creatinine, Urine: 61.38 mg/dL
Total Protein, Urine: <6 mg/dL

## 2019-12-08 LAB — CBC
HCT: 36.2 % (ref 36.0–46.0)
Hemoglobin: 11.6 g/dL — ABNORMAL LOW (ref 12.0–15.0)
MCH: 26.3 pg (ref 26.0–34.0)
MCHC: 32 g/dL (ref 30.0–36.0)
MCV: 82.1 fL (ref 80.0–100.0)
Platelets: 298 10*3/uL (ref 150–400)
RBC: 4.41 MIL/uL (ref 3.87–5.11)
RDW: 16 % — ABNORMAL HIGH (ref 11.5–15.5)
WBC: 8.8 10*3/uL (ref 4.0–10.5)
nRBC: 0 % (ref 0.0–0.2)

## 2019-12-08 MED ORDER — CYCLOBENZAPRINE HCL 10 MG PO TABS: 10.0000 mg | ORAL_TABLET | Freq: Every evening | ORAL | 1 refills | Status: AC | PRN

## 2019-12-08 NOTE — MAU Note (Signed)
Sent from MD office for BP evaluation.  Currently reports H/A, states sees occasional spots, not currently.  Denies epigastric pain.  Endorses +FM.  Denies VB or LOF.

## 2019-12-08 NOTE — MAU Provider Note (Signed)
Chief Complaint:  BP Evaluation   First Provider Initiated Contact with Patient 12/08/19 1349     HPI: [name] is a 34 y.o. G2P1001 at [redacted]w[redacted]d who presents to maternity admissions reporting elevated blood pressure at her MD appointment (130s/80s) and a headache she's had for "weeks" that is unresponsive to medications. She has tried Tylenol and fiorcet in the past, without complete relief. Today her headache is 2/10, felt mostly in the front and behind her eyes and occasionally bounding. She also reports seeing flashing spots but none currently. States that she was induced with her first delivery for gestational hypertension. Denies vaginal bleeding, leaking of fluid, decreased fetal movement, fever, falls, or recent illness.    Past Medical History:  Diagnosis Date  . Asthma    OB History  Gravida Para Term Preterm AB Living  2 1 1     1   SAB TAB Ectopic Multiple Live Births        0 1    # Outcome Date GA Lbr Len/2nd Weight Sex Delivery Anes PTL Lv  2 Current           1 Term 10/02/18 [redacted]w[redacted]d 10:46 / 00:59 8 lb 1.6 oz (3.674 kg) M Vag-Spont EPI, Local  LIV     Birth Comments: dusky, pale in appearance   Past Surgical History:  Procedure Laterality Date  . WRIST SURGERY     Family History  Problem Relation Age of Onset  . Asthma Mother    Social History   Tobacco Use  . Smoking status: Never Smoker  . Smokeless tobacco: Never Used  Vaping Use  . Vaping Use: Never used  Substance Use Topics  . Alcohol use: Not Currently  . Drug use: Never   Allergies  Allergen Reactions  . Other Anaphylaxis    Tree nuts-  Macadamia nuts anaphylaxis other reactions depend on the nut     No medications prior to admission.    I have reviewed patient's Past Medical Hx, Surgical Hx, Family Hx, Social Hx, medications and allergies.   ROS:  Review of Systems  Constitutional: Negative for fatigue and fever.  HENT: Negative for congestion and sore throat.   Eyes: Positive for  visual disturbance. Negative for photophobia.  Respiratory: Positive for chest tightness (see cardio note). Negative for shortness of breath.   Cardiovascular: Positive for chest pain and palpitations.       Feels hot all the time and sometimes has hot flashes that make her sweat, have a rapid pulse and mild chest pain.  Gastrointestinal: Negative for abdominal pain and nausea.  Neurological: Positive for headaches. Negative for dizziness, syncope and light-headedness.  All other systems reviewed and are negative.   Physical Exam   Patient Vitals for the past 24 hrs:  BP Temp Temp src Pulse Resp SpO2 Height Weight  12/08/19 1546 125/88 -- -- 90 -- -- -- --  12/08/19 1531 126/82 -- -- 98 -- -- -- --  12/08/19 1516 128/90 -- -- -- -- -- -- --  12/08/19 1446 113/80 -- -- -- -- -- -- --  12/08/19 1431 124/76 -- -- -- -- -- -- --  12/08/19 1416 128/87 -- -- -- -- -- -- --  12/08/19 1346 127/84 -- -- -- -- -- -- --  12/08/19 1331 126/85 -- -- -- -- -- -- --  12/08/19 1319 124/81 -- -- -- -- -- -- --  12/08/19 1257 130/87 98.2 F (36.8 C) Oral 88 18 99 % -- --  12/08/19 1253 -- -- -- -- -- -- 5\' 9"  (1.753 m) 292 lb 11.2 oz (132.8 kg)   Constitutional: Well-developed, well-nourished female in no acute distress.  Cardiovascular: normal rate & rhythm, no murmur Respiratory: normal effort, lung sounds clear throughout GI: Abd soft, non-tender, gravid appropriate for gestational age. Pos BS x 4 MS: Extremities nontender, mild dependent edema, normal ROM Neurologic: Alert and oriented x 4.  GU: no CVA tenderness  Pelvic exam deferred  Fetal Tracing: reactive Baseline: 135 Variability: moderate Accelerations: present Decelerations: none Toco: UI   Labs: Results for orders placed or performed during the hospital encounter of 12/08/19 (from the past 24 hour(s))  Urinalysis, Routine w reflex microscopic Urine, Clean Catch     Status: Abnormal   Collection Time: 12/08/19  1:01 PM  Result  Value Ref Range   Color, Urine YELLOW YELLOW   APPearance CLEAR CLEAR   Specific Gravity, Urine 1.008 1.005 - 1.030   pH 6.0 5.0 - 8.0   Glucose, UA NEGATIVE NEGATIVE mg/dL   Hgb urine dipstick NEGATIVE NEGATIVE   Bilirubin Urine NEGATIVE NEGATIVE   Ketones, ur NEGATIVE NEGATIVE mg/dL   Protein, ur NEGATIVE NEGATIVE mg/dL   Nitrite NEGATIVE NEGATIVE   Leukocytes,Ua TRACE (A) NEGATIVE   RBC / HPF 0-5 0 - 5 RBC/hpf   WBC, UA 0-5 0 - 5 WBC/hpf   Bacteria, UA RARE (A) NONE SEEN   Squamous Epithelial / LPF 0-5 0 - 5   Mucus PRESENT    Amorphous Crystal PRESENT   Protein / creatinine ratio, urine     Status: None   Collection Time: 12/08/19  2:01 PM  Result Value Ref Range   Creatinine, Urine 61.38 mg/dL   Total Protein, Urine <6.0 mg/dL   Protein Creatinine Ratio        0.00 - 0.15 mg/mg[Cre]  CBC     Status: Abnormal   Collection Time: 12/08/19  2:33 PM  Result Value Ref Range   WBC 8.8 4.0 - 10.5 K/uL   RBC 4.41 3.87 - 5.11 MIL/uL   Hemoglobin 11.6 (L) 12.0 - 15.0 g/dL   HCT 12/10/19 36 - 46 %   MCV 82.1 80.0 - 100.0 fL   MCH 26.3 26.0 - 34.0 pg   MCHC 32.0 30.0 - 36.0 g/dL   RDW 69.6 (H) 29.5 - 28.4 %   Platelets 298 150 - 400 K/uL   nRBC 0.0 0.0 - 0.2 %  Comprehensive metabolic panel     Status: Abnormal   Collection Time: 12/08/19  2:33 PM  Result Value Ref Range   Sodium 135 135 - 145 mmol/L   Potassium 3.9 3.5 - 5.1 mmol/L   Chloride 105 98 - 111 mmol/L   CO2 21 (L) 22 - 32 mmol/L   Glucose, Bld 87 70 - 99 mg/dL   BUN <5 (L) 6 - 20 mg/dL   Creatinine, Ser 12/10/19 0.44 - 1.00 mg/dL   Calcium 9.0 8.9 - 4.40 mg/dL   Total Protein 6.3 (L) 6.5 - 8.1 g/dL   Albumin 2.7 (L) 3.5 - 5.0 g/dL   AST 19 15 - 41 U/L   ALT 16 0 - 44 U/L   Alkaline Phosphatase 75 38 - 126 U/L   Total Bilirubin 0.3 0.3 - 1.2 mg/dL   GFR, Estimated 10.2 >72 mL/min   Anion gap 9 5 - 15    Imaging:  No results found.  MAU Course: Orders Placed This Encounter  Procedures  . Urinalysis, Routine w  reflex microscopic Urine, Clean Catch  . CBC  . Comprehensive metabolic panel  . Protein / creatinine ratio, urine  . Discharge patient   Meds ordered this encounter  Medications  . cyclobenzaprine (FLEXERIL) 10 MG tablet    Sig: Take 1 tablet (10 mg total) by mouth at bedtime as needed for muscle spasms (chronic tension headache).    Dispense:  30 tablet    Refill:  1    Order Specific Question:   Supervising Provider    Answer:   Samara Snide    MDM: PEC labs normal BP normal while in MAU Pt declined headache treatment in MAU but discharged with flexeril to take at bedtime  Assessment: 1. [redacted] weeks gestation of pregnancy   2. NST (non-stress test) reactive   3. Chronic tension-type headache, intractable     Plan: Discharge home in stable condition with return precautions.  BP check in office in one week   Follow-up Information    [facility] Go to.   Why: as scheduled for ongoing prenatal care Contact information: [address]  [address] [place] [address] [phone]               Allergies as of 12/08/2019      Reactions   Other Anaphylaxis   Tree nuts-  Macadamia nuts anaphylaxis other reactions depend on the nut        Medication List    TAKE these medications   albuterol 108 (90 Base) MCG/ACT inhaler Commonly known as: VENTOLIN HFA Inhale 2 puffs into the lungs every 4 (four) hours.   cyclobenzaprine 10 MG tablet Commonly known as: FLEXERIL Take 1 tablet (10 mg total) by mouth at bedtime as needed for muscle spasms (chronic tension headache).   ferrous sulfate 325 (65 FE) MG tablet Take 1 tablet (325 mg total) by mouth 2 (two) times daily with a meal.   FLUoxetine 20 MG tablet Commonly known as: PROZAC Take 20 mg by mouth daily.   metoCLOPramide 10 MG tablet Commonly known as: REGLAN Take 1 tablet (10 mg total) by mouth every 8 (eight) hours as needed (headache (to be taken with tylenol)).    multivitamin-prenatal 27-0.8 MG Tabs tablet Take 1 tablet by mouth daily at 12 noon.   pantoprazole 40 MG tablet Commonly known as: PROTONIX Take 40 mg by mouth daily.      Edd Arbour, CNM, MSN, [facility] 12/08/19 7:30 PM

## 2019-12-08 NOTE — Discharge Instructions (Signed)

## 2019-12-10 ENCOUNTER — Telehealth (HOSPITAL_COMMUNITY): Payer: Self-pay | Admitting: *Deleted

## 2019-12-10 NOTE — Telephone Encounter (Signed)
Preadmission screen  

## 2019-12-11 ENCOUNTER — Inpatient Hospital Stay (HOSPITAL_COMMUNITY)
Admission: AD | Admit: 2019-12-11 | Discharge: 2019-12-13 | DRG: 807 | Disposition: A | Discharge status: Home / Self Care | Payer: 59 | Attending: Obstetrics and Gynecology | Admitting: Obstetrics and Gynecology

## 2019-12-11 ENCOUNTER — Inpatient Hospital Stay (HOSPITAL_COMMUNITY): Payer: 59 | Admitting: Anesthesiology

## 2019-12-11 ENCOUNTER — Other Ambulatory Visit: Payer: Self-pay

## 2019-12-11 ENCOUNTER — Encounter (HOSPITAL_COMMUNITY): Payer: Self-pay | Admitting: Obstetrics and Gynecology

## 2019-12-11 DIAGNOSIS — O99344 Other mental disorders complicating childbirth: Secondary | ICD-10-CM | POA: Diagnosis present

## 2019-12-11 DIAGNOSIS — F419 Anxiety disorder, unspecified: Secondary | ICD-10-CM | POA: Diagnosis present

## 2019-12-11 DIAGNOSIS — R030 Elevated blood-pressure reading, without diagnosis of hypertension: Secondary | ICD-10-CM | POA: Diagnosis present

## 2019-12-11 DIAGNOSIS — O9952 Diseases of the respiratory system complicating childbirth: Secondary | ICD-10-CM | POA: Diagnosis present

## 2019-12-11 DIAGNOSIS — O134 Gestational [pregnancy-induced] hypertension without significant proteinuria, complicating childbirth: Secondary | ICD-10-CM | POA: Diagnosis present

## 2019-12-11 DIAGNOSIS — Z20822 Contact with and (suspected) exposure to covid-19: Secondary | ICD-10-CM | POA: Diagnosis present

## 2019-12-11 DIAGNOSIS — Z3A37 37 weeks gestation of pregnancy: Secondary | ICD-10-CM | POA: Diagnosis not present

## 2019-12-11 DIAGNOSIS — O99214 Obesity complicating childbirth: Secondary | ICD-10-CM | POA: Diagnosis present

## 2019-12-11 DIAGNOSIS — G44229 Chronic tension-type headache, not intractable: Secondary | ICD-10-CM | POA: Diagnosis present

## 2019-12-11 DIAGNOSIS — J45909 Unspecified asthma, uncomplicated: Secondary | ICD-10-CM | POA: Diagnosis present

## 2019-12-11 LAB — COMPREHENSIVE METABOLIC PANEL
ALT: 14 U/L (ref 0–44)
AST: 21 U/L (ref 15–41)
Albumin: 2.9 g/dL — ABNORMAL LOW (ref 3.5–5.0)
Alkaline Phosphatase: 83 U/L (ref 38–126)
Anion gap: 13 (ref 5–15)
BUN: <5 mg/dL — ABNORMAL LOW (ref 6–20)
CO2: 19 mmol/L — ABNORMAL LOW (ref 22–32)
Calcium: 9.2 mg/dL (ref 8.9–10.3)
Chloride: 106 mmol/L (ref 98–111)
Creatinine, Ser: 0.69 mg/dL (ref 0.44–1.00)
GFR, Estimated: >60 mL/min (ref >60)
Glucose, Bld: 97 mg/dL (ref 70–99)
Potassium: 3.5 mmol/L (ref 3.5–5.1)
Sodium: 138 mmol/L (ref 135–145)
Total Bilirubin: 0.4 mg/dL (ref 0.3–1.2)
Total Protein: 6.6 g/dL (ref 6.5–8.1)

## 2019-12-11 LAB — RESP PANEL BY RT-PCR (FLU A&B, COVID) ARPGX2
Influenza A by PCR: NEGATIVE
Influenza B by PCR: NEGATIVE
SARS Coronavirus 2 by RT PCR: NEGATIVE

## 2019-12-11 LAB — CBC
HCT: 37.3 % (ref 36.0–46.0)
Hemoglobin: 12.3 g/dL (ref 12.0–15.0)
MCH: 27 pg (ref 26.0–34.0)
MCHC: 33 g/dL (ref 30.0–36.0)
MCV: 81.8 fL (ref 80.0–100.0)
Platelets: 319 10*3/uL (ref 150–400)
RBC: 4.56 MIL/uL (ref 3.87–5.11)
RDW: 16.1 % — ABNORMAL HIGH (ref 11.5–15.5)
WBC: 9.8 10*3/uL (ref 4.0–10.5)
nRBC: 0 % (ref 0.0–0.2)

## 2019-12-11 LAB — PROTEIN / CREATININE RATIO, URINE
Creatinine, Urine: 125.46 mg/dL
Protein Creatinine Ratio: 0.12 mg/mg{Cre} (ref 0.00–0.15)
Total Protein, Urine: 15 mg/dL

## 2019-12-11 LAB — TYPE AND SCREEN
ABO/RH(D): A POS
Antibody Screen: NEGATIVE

## 2019-12-11 LAB — URIC ACID: Uric Acid, Serum: 5.3 mg/dL (ref 2.5–7.1)

## 2019-12-11 MED ORDER — SOD CITRATE-CITRIC ACID 500-334 MG/5ML PO SOLN: 30.0000 mL | ORAL | Status: DC | PRN

## 2019-12-11 MED ORDER — OXYTOCIN BOLUS FROM INFUSION
333.0000 mL | Freq: Once | INTRAVENOUS | Status: AC
  Administered 2019-12-11: 333 mL via INTRAVENOUS

## 2019-12-11 MED ORDER — EPHEDRINE 5 MG/ML INJ: 10.0000 mg | INTRAVENOUS | Status: DC | PRN

## 2019-12-11 MED ORDER — OXYTOCIN-SODIUM CHLORIDE 30-0.9 UT/500ML-% IV SOLN
2.5000 [IU]/h | INTRAVENOUS | Status: DC
  Filled 2019-12-11: qty 500

## 2019-12-11 MED ORDER — TERBUTALINE SULFATE 1 MG/ML IJ SOLN: 0.2500 mg | Freq: Once | INTRAMUSCULAR | Status: DC | PRN

## 2019-12-11 MED ORDER — LIDOCAINE HCL (PF) 1 % IJ SOLN
INTRAMUSCULAR | Status: DC | PRN
  Administered 2019-12-11 (×2): 5 mL via EPIDURAL

## 2019-12-11 MED ORDER — FENTANYL-BUPIVACAINE-NACL 0.5-0.125-0.9 MG/250ML-% EP SOLN
12.0000 mL/h | EPIDURAL | Status: DC | PRN
  Filled 2019-12-11: qty 250

## 2019-12-11 MED ORDER — LIDOCAINE HCL (PF) 1 % IJ SOLN: 30.0000 mL | INTRAMUSCULAR | Status: DC | PRN

## 2019-12-11 MED ORDER — ACETAMINOPHEN 325 MG PO TABS: 650.0000 mg | ORAL_TABLET | ORAL | Status: DC | PRN

## 2019-12-11 MED ORDER — FLEET ENEMA 7-19 GM/118ML RE ENEM: 1.0000 | ENEMA | Freq: Every day | RECTAL | Status: DC | PRN

## 2019-12-11 MED ORDER — ONDANSETRON HCL 4 MG/2ML IJ SOLN
4.0000 mg | Freq: Four times a day (QID) | INTRAMUSCULAR | Status: DC | PRN
  Administered 2019-12-11: 4 mg via INTRAVENOUS
  Filled 2019-12-11: qty 2

## 2019-12-11 MED ORDER — PHENYLEPHRINE 40 MCG/ML (10ML) SYRINGE FOR IV PUSH (FOR BLOOD PRESSURE SUPPORT): 80.0000 ug | PREFILLED_SYRINGE | INTRAVENOUS | Status: DC | PRN

## 2019-12-11 MED ORDER — SODIUM CHLORIDE (PF) 0.9 % IJ SOLN
INTRAMUSCULAR | Status: DC | PRN
  Administered 2019-12-11: 12 mL/h via EPIDURAL

## 2019-12-11 MED ORDER — LACTATED RINGERS IV SOLN: 500.0000 mL | INTRAVENOUS | Status: DC | PRN

## 2019-12-11 MED ORDER — PHENYLEPHRINE 40 MCG/ML (10ML) SYRINGE FOR IV PUSH (FOR BLOOD PRESSURE SUPPORT)
80.0000 ug | PREFILLED_SYRINGE | INTRAVENOUS | Status: DC | PRN
  Filled 2019-12-11: qty 10

## 2019-12-11 MED ORDER — LACTATED RINGERS IV SOLN
500.0000 mL | Freq: Once | INTRAVENOUS | Status: AC
  Administered 2019-12-11: 500 mL via INTRAVENOUS

## 2019-12-11 MED ORDER — OXYCODONE-ACETAMINOPHEN 5-325 MG PO TABS: 2.0000 | ORAL_TABLET | ORAL | Status: DC | PRN

## 2019-12-11 MED ORDER — OXYCODONE-ACETAMINOPHEN 5-325 MG PO TABS: 1.0000 | ORAL_TABLET | ORAL | Status: DC | PRN

## 2019-12-11 MED ORDER — DIPHENHYDRAMINE HCL 50 MG/ML IJ SOLN: 12.5000 mg | INTRAMUSCULAR | Status: DC | PRN

## 2019-12-11 MED ORDER — LACTATED RINGERS IV SOLN: INTRAVENOUS | Status: DC

## 2019-12-11 MED ORDER — OXYTOCIN-SODIUM CHLORIDE 30-0.9 UT/500ML-% IV SOLN
1.0000 m[IU]/min | INTRAVENOUS | Status: DC
  Administered 2019-12-11: 2 m[IU]/min via INTRAVENOUS

## 2019-12-11 NOTE — Progress Notes (Signed)
Pt's foley removed. in drainage bag.

## 2019-12-11 NOTE — Anesthesia Preprocedure Evaluation (Signed)
Anesthesia Evaluation  Patient identified by MRN, date of birth, ID band Patient awake    Reviewed: Allergy & Precautions, H&P , NPO status , Patient's Chart, lab work & pertinent test results  History of Anesthesia Complications Negative for: history of anesthetic complications  Airway Mallampati: II  TM Distance: >3 FB Neck ROM: full    Dental no notable dental hx. (+) Teeth Intact   Pulmonary asthma ,    Pulmonary exam normal breath sounds clear to auscultation       Cardiovascular Normal cardiovascular exam Rhythm:regular Rate:Normal     Neuro/Psych PSYCHIATRIC DISORDERS negative neurological ROS     GI/Hepatic negative GI ROS, Neg liver ROS,   Endo/Other  Morbid obesity  Renal/GU negative Renal ROS  negative genitourinary   Musculoskeletal   Abdominal   Peds  (+) ADHD Hematology negative hematology ROS (+)   Anesthesia Other Findings   Reproductive/Obstetrics (+) Pregnancy                             Anesthesia Physical Anesthesia Plan  ASA: III  Anesthesia Plan: Epidural   Post-op Pain Management:    Induction:   PONV Risk Score and Plan:   Airway Management Planned:   Additional Equipment:   Intra-op Plan:   Post-operative Plan:   Informed Consent: I have reviewed the patients History and Physical, chart, labs and discussed the procedure including the risks, benefits and alternatives for the proposed anesthesia with the patient or authorized representative who has indicated his/her understanding and acceptance.       Plan Discussed with:   Anesthesia Plan Comments:         Anesthesia Quick Evaluation

## 2019-12-11 NOTE — Anesthesia Procedure Notes (Signed)
Epidural Patient location during procedure: OB Start time: 12/11/2019 2:49 PM End time: 12/11/2019 2:59 PM  Staffing Anesthesiologist: Leonides Grills, MD Performed: anesthesiologist   Preanesthetic Checklist Completed: patient identified, IV checked, site marked, risks and benefits discussed, monitors and equipment checked, pre-op evaluation and timeout performed  Epidural Patient position: sitting Prep: DuraPrep Patient monitoring: heart rate, cardiac monitor, continuous pulse ox and blood pressure Approach: midline Location: L4-L5 Injection technique: LOR air  Needle:  Needle type: Tuohy  Needle gauge: 17 G Needle length: 9 cm Needle insertion depth: 8 cm Catheter type: closed end flexible Catheter size: 19 Gauge Catheter at skin depth: 13 cm Test dose: negative and Other  Assessment Events: blood not aspirated, injection not painful, no injection resistance and negative IV test  Additional Notes Informed consent obtained prior to proceeding including risk of failure, 1% risk of PDPH, risk of minor discomfort and bruising. Discussed alternatives to epidural analgesia and patient desires to proceed.  Timeout performed pre-procedure verifying patient name, procedure, and platelet count.  Patient tolerated procedure well. Reason for block:procedure for pain

## 2019-12-11 NOTE — Progress Notes (Signed)
CE at 1545 s/p epidiural 3/80/-2, AROM with minimal return of blood tinged fuid, Cat 1 tracing with pitocin 6 mU/min at that time  CE at 1800 4/80/-1, Cat 1 tracing with baseline 115, pitocin at 10 mU/min, TOCO q59m.  Continue to titrate per protocol. Denies PreE symptoms. PreE labs WNL (uPC 0.12)  Anticipate SVD

## 2019-12-11 NOTE — H&P (Addendum)
[name] is a 34 y.o. female presenting for IOL for elevated BP at term. +HA but denies visual changes, SOB, CP, N/V, RUQ pain. Notes BL LE.   PNC c/b anxiety on prozac, maternal obesity and asthma with PRN inhaler  OB History    Gravida  2   Para  1   Term  1   Preterm      AB      Living  1     SAB      TAB      Ectopic      Multiple  0   Live Births  1          Past Medical History:  Diagnosis Date  . Asthma    Past Surgical History:  Procedure Laterality Date  . WRIST SURGERY     Family History: family history includes Asthma in her mother. Social History:  reports that she has never smoked. She has never used smokeless tobacco. She reports previous alcohol use. She reports that she does not use drugs.     Maternal Diabetes: No1hr 136, 3hr WNL Genetic Screening: Normal Maternal Ultrasounds/Referrals: Normal Fetal Ultrasounds or other Referrals:  None Maternal Substance Abuse:  No Significant Maternal Medications:  Meds include: Prozac Significant Maternal Lab Results:  Group B Strep negative Other Comments:  None  Review of Systems History   unknown if currently breastfeeding. Exam Physical Exam  Prenatal labs: ABO, Rh:  Apos Antibody:  neg Rubella:  imm RPR:   nr HBsAg:   neg HIV: Non Reactive (05/02 2104)  GBS: Negative/-- (11/22 0000)   Assessment/Plan: This is a 34yo G2P1001 @ 37 5/7 by LMP c/w TVUS admitted for IOL for elevated BP at term. GBS neg. CE in office tight 3/50/-3. EFW 3410g, PNC c/b anxiety on prozac. Desires epidural. Plan for AROM and pitocin. Anticipate SVD, pelvis proven to 8lb1oz  [name] 12/11/2019, 12:21 PM

## 2019-12-11 NOTE — H&P (Signed)
Earlier progress note is actually H&P, mislabeled. Please refer

## 2019-12-12 LAB — CBC
HCT: 35.3 % — ABNORMAL LOW (ref 36.0–46.0)
Hemoglobin: 11.4 g/dL — ABNORMAL LOW (ref 12.0–15.0)
MCH: 26.6 pg (ref 26.0–34.0)
MCHC: 32.3 g/dL (ref 30.0–36.0)
MCV: 82.5 fL (ref 80.0–100.0)
Platelets: 256 10*3/uL (ref 150–400)
RBC: 4.28 MIL/uL (ref 3.87–5.11)
RDW: 16 % — ABNORMAL HIGH (ref 11.5–15.5)
WBC: 11 10*3/uL — ABNORMAL HIGH (ref 4.0–10.5)
nRBC: 0 % (ref 0.0–0.2)

## 2019-12-12 LAB — RPR: RPR Ser Ql: NONREACTIVE

## 2019-12-12 MED ORDER — COCONUT OIL OIL
1.0000 "application " | TOPICAL_OIL | Status: DC | PRN
  Administered 2019-12-12: 1 via TOPICAL

## 2019-12-12 MED ORDER — BENZOCAINE-MENTHOL 20-0.5 % EX AERO
1.0000 "application " | INHALATION_SPRAY | CUTANEOUS | Status: DC | PRN
  Administered 2019-12-12: 1 via TOPICAL
  Filled 2019-12-12: qty 56

## 2019-12-12 MED ORDER — IBUPROFEN 600 MG PO TABS
600.0000 mg | ORAL_TABLET | Freq: Four times a day (QID) | ORAL | Status: DC
  Administered 2019-12-12 – 2019-12-13 (×7): 600 mg via ORAL
  Filled 2019-12-12 (×7): qty 1

## 2019-12-12 MED ORDER — ZOLPIDEM TARTRATE 5 MG PO TABS: 5.0000 mg | ORAL_TABLET | Freq: Every evening | ORAL | Status: DC | PRN

## 2019-12-12 MED ORDER — SENNOSIDES-DOCUSATE SODIUM 8.6-50 MG PO TABS
2.0000 | ORAL_TABLET | ORAL | Status: DC
  Administered 2019-12-12 – 2019-12-13 (×2): 2 via ORAL
  Filled 2019-12-12 (×2): qty 2

## 2019-12-12 MED ORDER — WITCH HAZEL-GLYCERIN EX PADS: 1.0000 "application " | MEDICATED_PAD | CUTANEOUS | Status: DC | PRN

## 2019-12-12 MED ORDER — ACETAMINOPHEN 325 MG PO TABS
650.0000 mg | ORAL_TABLET | ORAL | Status: DC | PRN
  Administered 2019-12-12 – 2019-12-13 (×4): 650 mg via ORAL
  Filled 2019-12-12 (×4): qty 2

## 2019-12-12 MED ORDER — ONDANSETRON HCL 4 MG PO TABS: 4.0000 mg | ORAL_TABLET | ORAL | Status: DC | PRN

## 2019-12-12 MED ORDER — DIBUCAINE (PERIANAL) 1 % EX OINT: 1.0000 "application " | TOPICAL_OINTMENT | CUTANEOUS | Status: DC | PRN

## 2019-12-12 MED ORDER — TETANUS-DIPHTH-ACELL PERTUSSIS 5-2.5-18.5 LF-MCG/0.5 IM SUSY: 0.5000 mL | PREFILLED_SYRINGE | Freq: Once | INTRAMUSCULAR | Status: DC

## 2019-12-12 MED ORDER — FLUOXETINE HCL 20 MG PO CAPS
20.0000 mg | ORAL_CAPSULE | Freq: Every day | ORAL | Status: DC
  Administered 2019-12-12 – 2019-12-13 (×2): 20 mg via ORAL
  Filled 2019-12-12 (×2): qty 1

## 2019-12-12 MED ORDER — PANTOPRAZOLE SODIUM 40 MG PO TBEC
40.0000 mg | DELAYED_RELEASE_TABLET | Freq: Every day | ORAL | Status: DC
  Administered 2019-12-12 – 2019-12-13 (×2): 40 mg via ORAL
  Filled 2019-12-12 (×2): qty 1

## 2019-12-12 MED ORDER — DIPHENHYDRAMINE HCL 25 MG PO CAPS: 25.0000 mg | ORAL_CAPSULE | Freq: Four times a day (QID) | ORAL | Status: DC | PRN

## 2019-12-12 MED ORDER — PRENATAL MULTIVITAMIN CH
1.0000 | ORAL_TABLET | Freq: Every day | ORAL | Status: DC
  Administered 2019-12-12 – 2019-12-13 (×2): 1 via ORAL
  Filled 2019-12-12 (×2): qty 1

## 2019-12-12 MED ORDER — SIMETHICONE 80 MG PO CHEW: 80.0000 mg | CHEWABLE_TABLET | ORAL | Status: DC | PRN

## 2019-12-12 MED ORDER — ONDANSETRON HCL 4 MG/2ML IJ SOLN: 4.0000 mg | INTRAMUSCULAR | Status: DC | PRN

## 2019-12-12 NOTE — Anesthesia Postprocedure Evaluation (Signed)
Anesthesia Post Note  Patient: [name]  Procedure(s) Performed: AN AD HOC LABOR EPIDURAL     Patient location during evaluation: Mother Baby Anesthesia Type: Epidural Level of consciousness: awake and alert Pain management: pain level controlled Vital Signs Assessment: post-procedure vital signs reviewed and stable Respiratory status: spontaneous breathing Cardiovascular status: stable Postop Assessment: no headache, adequate PO intake, no backache, patient able to bend at knees, able to ambulate and no apparent nausea or vomiting Anesthetic complications: no   No complications documented.  Last Vitals:  Vitals:   12/12/19 0100 12/12/19 0345  BP: 122/81 129/83  Pulse: 76 80  Resp: 18 16  Temp: 36.7 C 36.5 C  SpO2: 94% 94%    Last Pain:  Vitals:   12/12/19 0600  TempSrc:   PainSc: 3    Pain Goal: Patients Stated Pain Goal: 6 (12/11/19 1520)                 Telitha Plath, Weldon Picking

## 2019-12-12 NOTE — Progress Notes (Signed)
CSW received consult for hx of PP Anxiety and PP Depression.  CSW met with MOB to offer support and complete assessment.    CSW congratulated MOB on the birth of infant. CSW advised MOB of CSW's role and the reason for CSW coming to speak with her. 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 MOB expressed that she was placed on Prozac in which MOB expressed she remains on at this time. MOB expressed that her current medication regiment has been working well for her. MOB expressed that when dealing with PPD/A MOB felt "really sad and didn't want to hurt anyone". CSW understanding of this and took time to provide MOB with PPD/A and SIDS education. MOB was PPD Checklist in order to keep track of feelings as they may relate to PPD. MOB reported no other mental health hx and denies SI, HI, DV.   CSW inquired from MOB on who her supports were in which MOB expressed that she has support from her family. MOB expressed that she has all needed items to care for infant with plans for pt to sleep in crib once arrived home.   CSW provided review of Sudden Infant Death Syndrome (SIDS) precautions.   CSW identifies no further need for intervention and no barriers to discharge at this time.  Nehemie Casserly S. Oren Barella, MSW, LCSW [facility] [phone]  

## 2019-12-12 NOTE — Progress Notes (Signed)
Patient ID: [name], female   DOB: 1985/05/22, 34 y.o.   MRN: [id number] Pt doing well with no complaints. Denies HA, CO, SOB. She is bonding well with baby. Lochia mild. Pain well controlled. Tolerating diet well. Voiding with no issues VSS GEN - no complaints ABD - FF and 2cm below umbilicus EXT - no homans   11>11.4<256  A/P: PPD#1 s/p svd - stable         PIH - BP stable no meds         Examined baby's penis and agree with circumcision with pediatric urologist         Routine pp care; likely discharge home tomorrow

## 2019-12-12 NOTE — Discharge Instructions (Signed)
Call office with any concerns [phone] 

## 2019-12-12 NOTE — Lactation Note (Signed)
This note was copied from a baby's chart. Lactation Consultation Note  Patient Name: [name] [id number]'[id number] Date: 12/12/2019  [facility] entered room.  Dad holding infant who is content and mom just waking up.  Mom reports [name]  Is her second baby.  Mom reports she breastfed with her first baby for about 6 weeks and then pumped for another 14 weeks.  Mom is a Producer, television/film/video.  Did not discuss DEBP at this time. Mom reports oversupply with her first and several rounds of mastitis. Mom reports left nipple sore.  Mom has small blister on left nipple and it is reddened.  Urged mom to hand express and rub expressed mothers milk on nipoples and air dry.  Mom reports it is about time for [name] to eat.  LC minimally assisted [name] with latching in laid back breastfeeding position. Mom has manual breast pump.  Used manual pump on right to prepump nipple to elongate to make it easier to latch.  [name] fed wonderfully for about 10-12 minutes then started to get fussy.  He was coming off and on and would not latch.  Mom attempted to put him in football.  He would not latch.  Gassy/fussy.  Mom put him STS.  Praised breastfeeding.Left STS.  Urged to call lactation as needed.      Maternal Data    Feeding    LATCH Score                   Interventions    Lactation Tools Discussed/Used     Consult Status      [name] 12/12/2019, 5:54 PM

## 2019-12-12 NOTE — Lactation Note (Signed)
This note was copied from a baby's chart. Lactation Consultation Note  Patient Name: [name] Date: 12/12/2019   [facility] visit attempted at 16 hrs of life, but Mom & baby were sleeping. LC to return later.   Lurline Hare [facility] 12/12/2019, 1:07 PM

## 2019-12-13 MED ORDER — IBUPROFEN 600 MG PO TABS: 600.0000 mg | ORAL_TABLET | Freq: Four times a day (QID) | ORAL | 1 refills | Status: AC | PRN

## 2019-12-13 NOTE — Lactation Note (Addendum)
This note was copied from a baby's chart. Lactation Consultation Note  Patient Name: [name] MWNUU'V Date: 12/13/2019 Reason for consult: Difficult latch;Mother's request;Early term 37-38.6wks P2, 28 hour ETI female infant -3% weight loss. Mom's feeding choice is breast and formula feeding.  Per mom, breast are sore and she welcome assistance from [facility] with latch. Mom latched infant  on her left breast using the football hold position, infant latched with depth and swallows heard, and mom  then switch to her right breast using the cross cradle hold, infant latched with depth and was still breastfeeding when LC left the room. Mom understands if she feels pain with latch to break latch and re-latch infant. Mom knows how to keep infant stimulated to BF by doing breast compressions, gently stroking infant's shoulder and neck and talking to infant. 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   Per mom she experienced engoregment and had oversupply with her first child, LC discussed how to prevent engorgement and  oversupply. Mom is using only the  hand pump due to her  past hx of oversupply and doing hand expression to give infant back extra volume after latching infant at the breast.  Maternal Data\     Feeding Feeding Type: Breast Fed  LATCH Score Latch: Grasps breast easily, tongue down, lips flanged, rhythmical sucking.  Audible Swallowing: Spontaneous and intermittent  Type of Nipple: Everted at rest and after stimulation  Comfort (Breast/Nipple): Soft / non-tender  Hold (Positioning): Assistance needed to correctly position infant at breast and maintain latch.  LATCH Score: 9  Interventions Interventions: Skin to skin;Breast compression;Adjust position;Support pillows;Breast massage;Hand express;Assisted with latch;Position  options;Expressed milk  Lactation Tools Discussed/Used Tools: Nipple Shields Nipple shield size: 24   Consult Status Consult Status: Follow-up Date: 12/13/19 Follow-up type: In-patient    Danelle Earthly 12/13/2019, 12:28 AM

## 2019-12-13 NOTE — Progress Notes (Signed)
Post Partum Day 2 Subjective: no complaints, up ad lib, voiding, tolerating PO, + flatus and lochia mild. Feels well rested today. No HA or CP or SOB. Switched to formular feeding last night. Ready for discharge to home today  Objective: Blood pressure 122/89, pulse 73, temperature 98.1 F (36.7 C), temperature source Oral, resp. rate 17, height 5\' 9"  (1.753 m), weight 132.6 kg, SpO2 98 %, unknown if currently breastfeeding.  Physical Exam:  General: alert, cooperative and no distress Lochia: appropriate Uterine Fundus: firm Incision: n/a DVT Evaluation: No evidence of DVT seen on physical exam. Calf/Ankle edema is present.  Recent Labs    12/11/19 1203 12/12/19 0527  HGB 12.3 11.4*  HCT 37.3 35.3*    Assessment/Plan: Discharge home  Instructions reviewed   LOS: 2 days   [name] W [name] 12/13/2019, 11:56 AM

## 2019-12-13 NOTE — Discharge Summary (Signed)
Postpartum Discharge Summary  Date of Service updated      Patient Name: [name] DOB: [birth year] MRN: [id number]  Date of admission: 12/11/2019 Delivery date:12/11/2019  Delivering provider: Carlisle Cater  Date of discharge: 12/13/2019  Admitting diagnosis: Gestational (pregnancy-induced) hypertension without significant proteinuria, complicating childbirth [O13.4] Intrauterine pregnancy: [redacted]w[redacted]d     Secondary diagnosis:  Active Problems:   Gestational (pregnancy-induced) hypertension without significant proteinuria, complicating childbirth  Additional problems: none    Discharge diagnosis: Term Pregnancy Delivered and Gestational Hypertension                                              Post partum procedures:n/a Augmentation: AROM and Pitocin Complications: None  Hospital course: Induction of Labor With Vaginal Delivery   34 y.o. yo G2P2002 at [redacted]w[redacted]d was admitted to the hospital 12/11/2019 for induction of labor.  Indication for induction: Gestational hypertension.  Patient had an uncomplicated labor course as follows: Membrane Rupture Time/Date: 3:45 PM ,12/11/2019   Delivery Method:Vaginal, Spontaneous  Episiotomy: None  Lacerations:  Labial  Details of delivery can be found in separate delivery note.  Patient had a routine postpartum course. Patient is discharged home 12/13/19.  Newborn Data: Birth date:12/11/2019  Birth time:8:12 PM  Gender:Female  Living status:Living  Apgars:8 ,9  Weight:3144 g   Magnesium Sulfate received: No BMZ received: No Rhophylac:N/A  Physical exam  Vitals:   12/12/19 1225 12/12/19 1411 12/12/19 2031 12/13/19 0635  BP: 129/74 116/84 126/83 122/89  Pulse: 74 76 72 73  Resp: 16 16 15 17   Temp: 98.4 F (36.9 C) 98.3 F (36.8 C) 98.1 F (36.7 C) 98.1 F (36.7 C)  TempSrc: Oral Oral Oral Oral  SpO2: 98% 99% 99% 98%  Weight:      Height:       General: alert, cooperative and no distress Lochia: appropriate Uterine  Fundus: firm Incision: N/A DVT Evaluation: No evidence of DVT seen on physical exam. Calf/Ankle edema is present Labs: Lab Results  Component Value Date   WBC 11.0 (H) 12/12/2019   HGB 11.4 (L) 12/12/2019   HCT 35.3 (L) 12/12/2019   MCV 82.5 12/12/2019   PLT 256 12/12/2019   CMP Latest Ref Rng & Units 12/11/2019  Glucose 70 - 99 mg/dL 97  BUN 6 - 20 mg/dL 14/02/2019)  Creatinine <9(C - 1.00 mg/dL 7.89  Sodium 3.81 - 017 mmol/L 138  Potassium 3.5 - 5.1 mmol/L 3.5  Chloride 98 - 111 mmol/L 106  CO2 22 - 32 mmol/L 19(L)  Calcium 8.9 - 10.3 mg/dL 9.2  Total Protein 6.5 - 8.1 g/dL 6.6  Total Bilirubin 0.3 - 1.2 mg/dL 0.4  Alkaline Phos 38 - 126 U/L 83  AST 15 - 41 U/L 21  ALT 0 - 44 U/L 14   Edinburgh Score: Edinburgh Postnatal Depression Scale Screening Tool 12/13/2019  I have been able to laugh and see the funny side of things. 0  I have looked forward with enjoyment to things. 0  I have blamed myself unnecessarily when things went wrong. 1  I have been anxious or worried for no good reason. 1  I have felt scared or panicky for no good reason. 0  Things have been getting on top of me. 0  I have been so unhappy that I have had difficulty sleeping. 0  I have felt  sad or miserable. 0  I have been so unhappy that I have been crying. 0  The thought of harming myself has occurred to me. 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   cyclobenzaprine 10 MG tablet Commonly known as: FLEXERIL Take 1 tablet (10 mg total) by mouth at bedtime as needed for muscle spasms (chronic tension headache).   ferrous sulfate 325 (65 FE) MG tablet Take 1  tablet (325 mg total) by mouth 2 (two) times daily with a meal.   FLUoxetine 20 MG tablet Commonly known as: PROZAC Take 20 mg by mouth daily.   ibuprofen 600 MG tablet Commonly known as: ADVIL Take 1 tablet (600 mg total) by mouth every 6 (six) hours as needed for moderate pain or cramping.   metoCLOPramide 10 MG tablet Commonly known as: REGLAN Take 1 tablet (10 mg total) by mouth every 8 (eight) hours as needed (headache (to be taken with tylenol)).   multivitamin-prenatal 27-0.8 MG Tabs tablet Take 1 tablet by mouth daily at 12 noon.   pantoprazole 40 MG tablet Commonly known as: PROTONIX Take 40 mg by mouth daily.        Discharge home in stable condition Infant Feeding: Bottle Infant Disposition:home with mother Discharge instruction: per After Visit Summary and Postpartum booklet. Activity: Advance as tolerated. Pelvic rest for 6 weeks.  Diet: low salt diet Anticipated Birth Control: Unsure Postpartum Appointment:6 weeks Additional Postpartum F/U: BP check 1 week Future Appointments:No future appointments. Follow up Visit:  Follow-up Information    Shivaji, Valerie Roys, MD. Schedule an appointment as soon as possible for a visit in 6 week(s).   Specialty: Obstetrics and Gynecology Why: For postpartum visit 1 week for BP check  Contact information: [address] [place] [address] [place] [address] [phone]                   12/13/2019 Cathrine Muster, DO

## 2019-12-22 ENCOUNTER — Other Ambulatory Visit (HOSPITAL_COMMUNITY): Payer: 59

## 2019-12-23 ENCOUNTER — Inpatient Hospital Stay (HOSPITAL_COMMUNITY): Payer: 59

## 2020-08-01 IMAGING — US US OB < 14 WEEKS - US OB TV
1 series · 15 of 28 positions shown · non-contrast
Comparison: None.

CLINICAL DATA: Cramping and spotting

EXAM:
OBSTETRIC <14 WK US AND TRANSVAGINAL OB US
TECHNIQUE: Both transabdominal and transvaginal ultrasound examinations were
performed for complete evaluation of the gestation as well as the
maternal uterus, adnexal regions, and pelvic cul-de-sac.
Transvaginal technique was performed to assess early pregnancy.

[Series 1: us ob < 14 weeks - us ob tv · 15 of 36 slices shown]
[im 1/36]
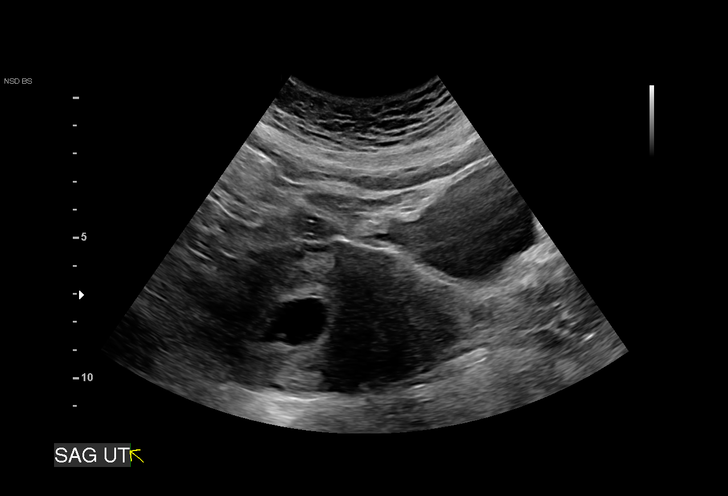
[im 3/36]
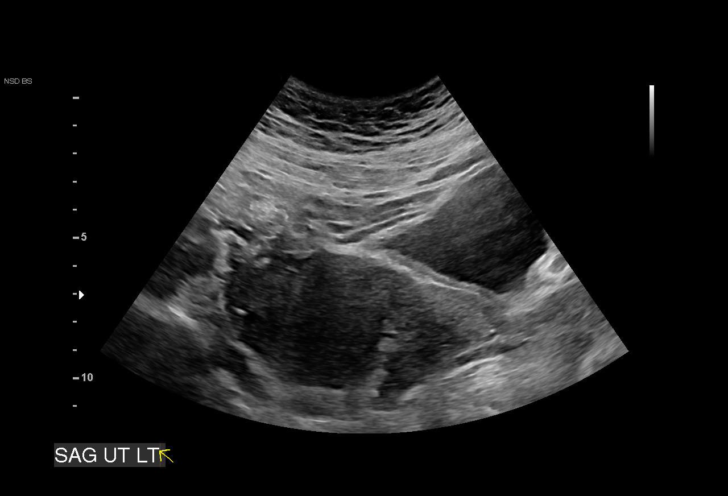
[im 6/36]
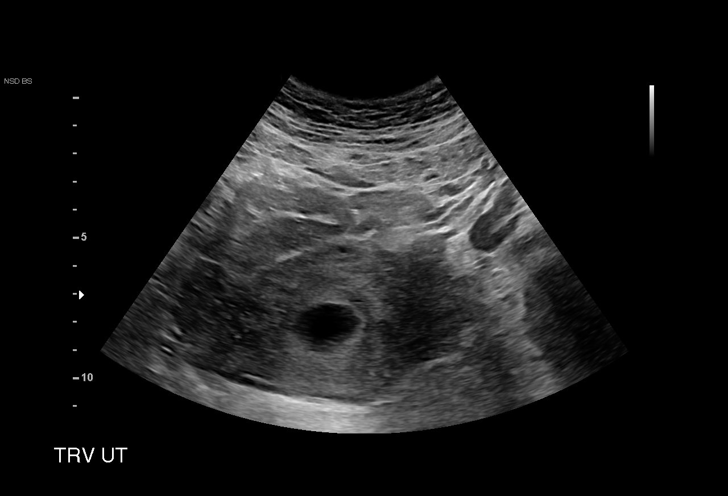
[im 8/36]
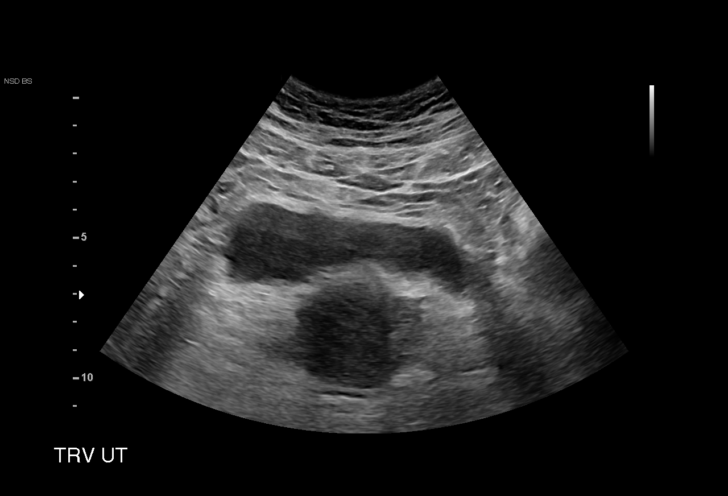
[im 11/36]
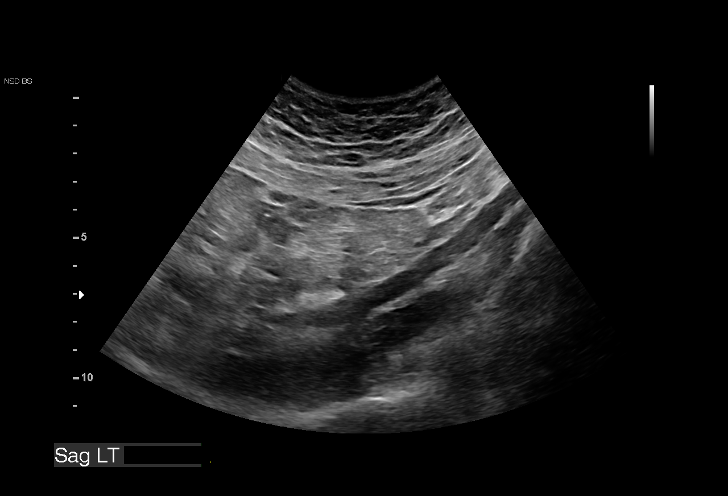
[im 13/36]
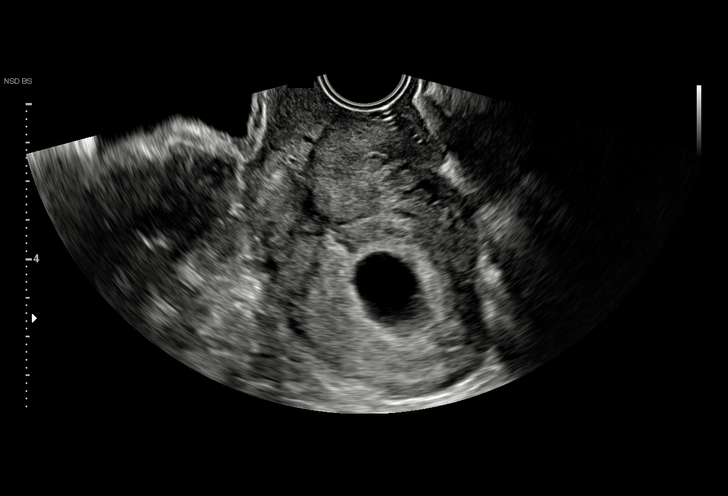
[im 16/36]
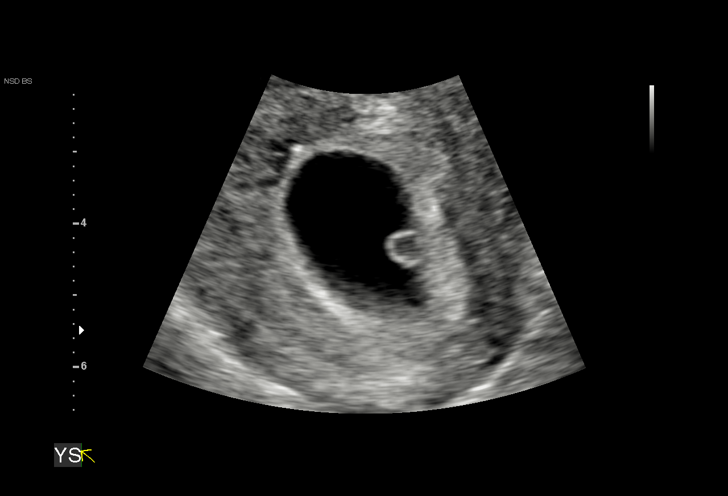
[im 19/36]
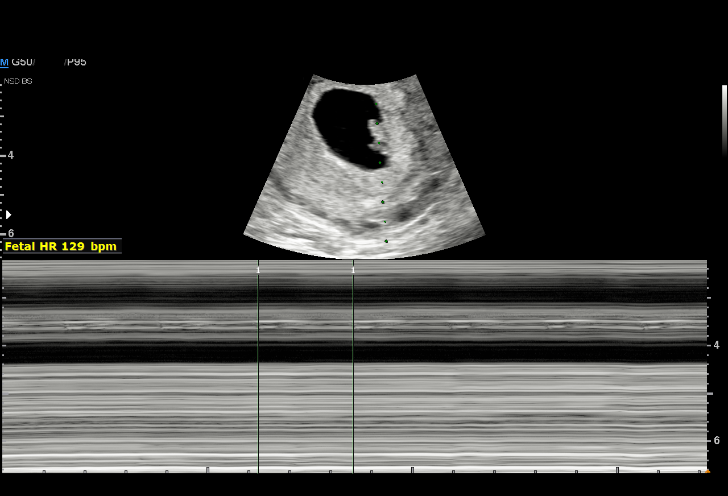
[im 20/36]
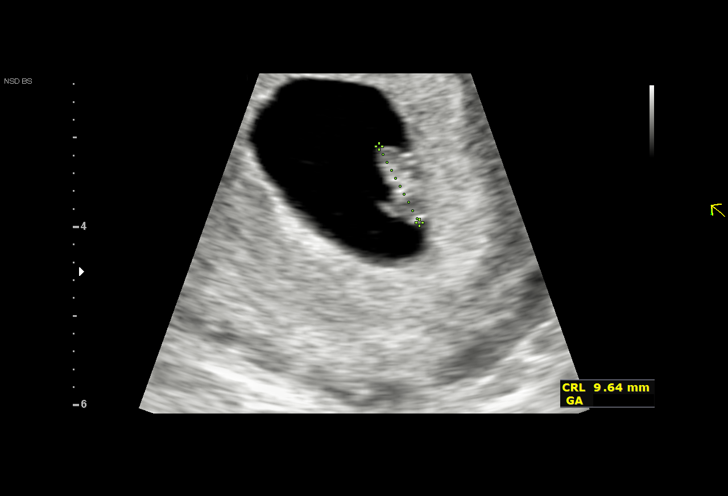
[im 23/36]
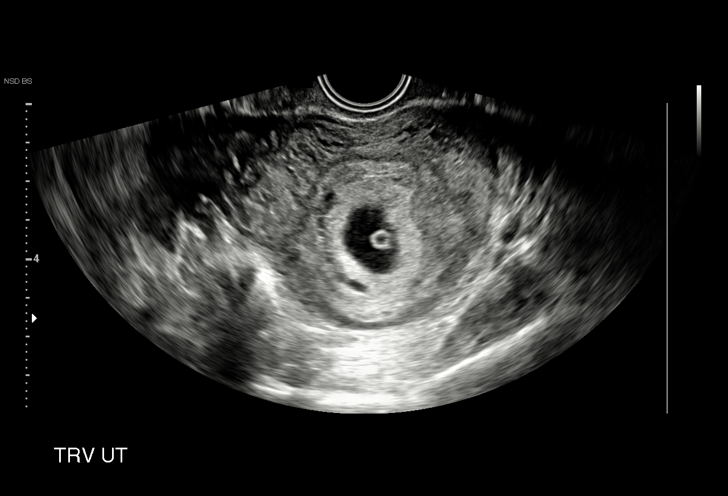
[im 25/36]
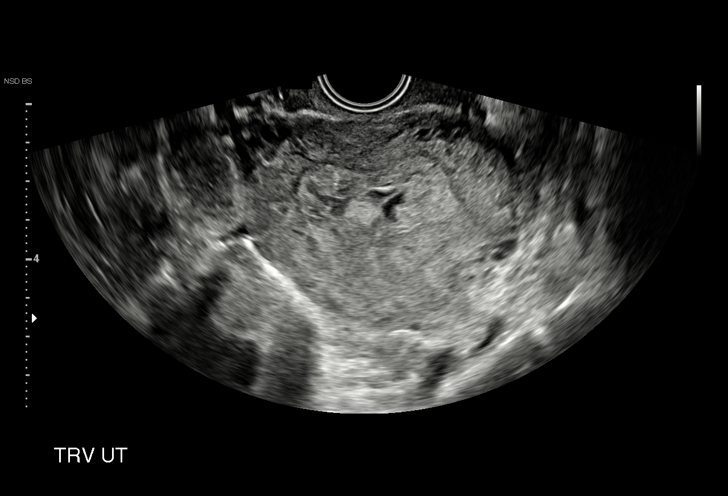
[im 28/36]
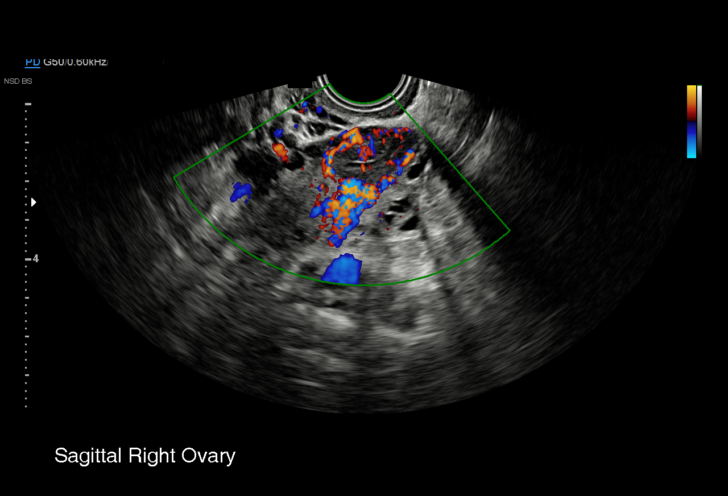
[im 30/36]
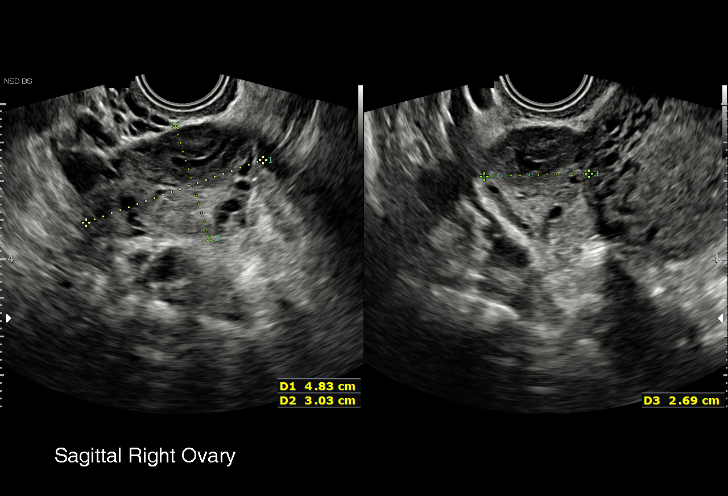
[im 33/36]
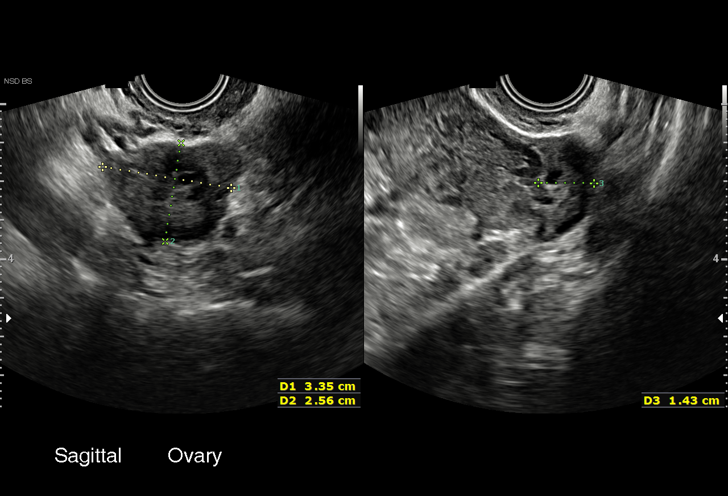
[im 36/36]
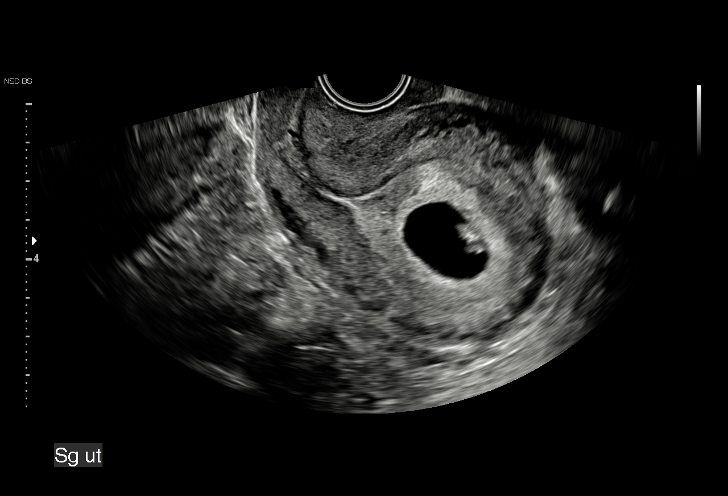

[15 of 28 positions shown; findings below may reference images not displayed]

FINDINGS: Intrauterine gestational sac: Single

Yolk sac:  Visualized.

Embryo:  Visualized.

Cardiac Activity: Visualized.

Heart Rate: 129 bpm

CRL: 9.6 mm   7 w   0 d                  US EDC: 12/28/2019

Subchorionic hemorrhage: There is a small volume of subchorionic
hemorrhage.

Maternal uterus/adnexae: The ovaries are unremarkable. There is a
trace amount of pelvic free fluid.
IMPRESSION: Single live IUP at 7 weeks and 0 days as detailed above.
